# Patient Record
Sex: Female | Born: 1985 | Race: White | Hispanic: No | Marital: Married | Smoking: Never smoker
Health system: Southern US, Community
[De-identification: ages and names within clinical notes are randomized; demographics above are authoritative.]

## PROBLEM LIST (undated history)

## (undated) DIAGNOSIS — Z8619 Personal history of other infectious and parasitic diseases: Secondary | ICD-10-CM

## (undated) DIAGNOSIS — Z789 Other specified health status: Secondary | ICD-10-CM

## (undated) HISTORY — PX: WISDOM TOOTH EXTRACTION: SHX21

---

## 2014-07-02 LAB — OB RESULTS CONSOLE GC/CHLAMYDIA
CHLAMYDIA, DNA PROBE: NEGATIVE
Gonorrhea: NEGATIVE

## 2014-07-28 LAB — OB RESULTS CONSOLE RPR: RPR: NONREACTIVE

## 2014-07-28 LAB — OB RESULTS CONSOLE HEPATITIS B SURFACE ANTIGEN: Hepatitis B Surface Ag: NEGATIVE

## 2014-07-28 LAB — OB RESULTS CONSOLE HIV ANTIBODY (ROUTINE TESTING): HIV: NONREACTIVE

## 2014-07-28 LAB — OB RESULTS CONSOLE RUBELLA ANTIBODY, IGM: RUBELLA: IMMUNE

## 2014-07-28 LAB — OB RESULTS CONSOLE ANTIBODY SCREEN: Antibody Screen: NEGATIVE

## 2014-07-28 LAB — OB RESULTS CONSOLE ABO/RH: RH Type: POSITIVE

## 2014-11-22 LAB — OB RESULTS CONSOLE RPR: RPR: NONREACTIVE

## 2015-01-21 LAB — OB RESULTS CONSOLE GBS: GBS: NEGATIVE

## 2015-02-23 ENCOUNTER — Inpatient Hospital Stay (HOSPITAL_COMMUNITY)
Admission: AD | Admit: 2015-02-23 | Discharge: 2015-02-27 | DRG: 766 | Disposition: A | Discharge status: Home / Self Care | Payer: BLUE CROSS/BLUE SHIELD | Source: Ambulatory Visit | Attending: Obstetrics and Gynecology | Admitting: Obstetrics and Gynecology

## 2015-02-23 ENCOUNTER — Encounter (HOSPITAL_COMMUNITY): Payer: Self-pay | Admitting: *Deleted

## 2015-02-23 DIAGNOSIS — O9989 Other specified diseases and conditions complicating pregnancy, childbirth and the puerperium: Secondary | ICD-10-CM | POA: Diagnosis present

## 2015-02-23 DIAGNOSIS — O324XX0 Maternal care for high head at term, not applicable or unspecified: Secondary | ICD-10-CM | POA: Diagnosis present

## 2015-02-23 DIAGNOSIS — Z3A40 40 weeks gestation of pregnancy: Secondary | ICD-10-CM | POA: Diagnosis present

## 2015-02-23 DIAGNOSIS — O288 Other abnormal findings on antenatal screening of mother: Secondary | ICD-10-CM | POA: Diagnosis present

## 2015-02-23 HISTORY — DX: Other specified health status: Z78.9

## 2015-02-23 LAB — TYPE AND SCREEN
ABO/RH(D): A POS
Antibody Screen: NEGATIVE

## 2015-02-23 LAB — CBC
HCT: 31.2 % — ABNORMAL LOW (ref 36.0–46.0)
HEMOGLOBIN: 10.9 g/dL — AB (ref 12.0–15.0)
MCH: 27.5 pg (ref 26.0–34.0)
MCHC: 34.9 g/dL (ref 30.0–36.0)
MCV: 78.6 fL (ref 78.0–100.0)
Platelets: 233 10*3/uL (ref 150–400)
RBC: 3.97 MIL/uL (ref 3.87–5.11)
RDW: 13.4 % (ref 11.5–15.5)
WBC: 6.3 10*3/uL (ref 4.0–10.5)

## 2015-02-23 MED ORDER — BUTORPHANOL TARTRATE 1 MG/ML IJ SOLN
1.0000 mg | INTRAMUSCULAR | Status: DC | PRN
  Administered 2015-02-24 (×2): 1 mg via INTRAVENOUS
  Filled 2015-02-23 (×4): qty 1

## 2015-02-23 MED ORDER — OXYTOCIN 40 UNITS IN LACTATED RINGERS INFUSION - SIMPLE MED: 62.5000 mL/h | INTRAVENOUS | Status: DC

## 2015-02-23 MED ORDER — OXYTOCIN BOLUS FROM INFUSION: 500.0000 mL | INTRAVENOUS | Status: DC

## 2015-02-23 MED ORDER — LIDOCAINE HCL (PF) 1 % IJ SOLN
30.0000 mL | INTRAMUSCULAR | Status: DC | PRN
  Filled 2015-02-23: qty 30

## 2015-02-23 MED ORDER — FLEET ENEMA 7-19 GM/118ML RE ENEM: 1.0000 | ENEMA | RECTAL | Status: DC | PRN

## 2015-02-23 MED ORDER — OXYCODONE-ACETAMINOPHEN 5-325 MG PO TABS: 2.0000 | ORAL_TABLET | ORAL | Status: DC | PRN

## 2015-02-23 MED ORDER — MISOPROSTOL 25 MCG QUARTER TABLET
25.0000 ug | ORAL_TABLET | ORAL | Status: DC | PRN
  Administered 2015-02-23 – 2015-02-24 (×2): 25 ug via VAGINAL
  Filled 2015-02-23 (×2): qty 0.25

## 2015-02-23 MED ORDER — OXYCODONE-ACETAMINOPHEN 5-325 MG PO TABS: 1.0000 | ORAL_TABLET | ORAL | Status: DC | PRN

## 2015-02-23 MED ORDER — LACTATED RINGERS IV SOLN
INTRAVENOUS | Status: DC
  Administered 2015-02-23 – 2015-02-25 (×6): via INTRAVENOUS

## 2015-02-23 MED ORDER — ONDANSETRON HCL 4 MG/2ML IJ SOLN
4.0000 mg | Freq: Four times a day (QID) | INTRAMUSCULAR | Status: DC | PRN
  Administered 2015-02-25: 4 mg via INTRAVENOUS

## 2015-02-23 MED ORDER — LACTATED RINGERS IV SOLN
500.0000 mL | INTRAVENOUS | Status: DC | PRN
  Administered 2015-02-24 (×2): 500 mL via INTRAVENOUS
  Administered 2015-02-24: 1000 mL via INTRAVENOUS

## 2015-02-23 MED ORDER — ACETAMINOPHEN 325 MG PO TABS: 650.0000 mg | ORAL_TABLET | ORAL | Status: DC | PRN

## 2015-02-23 MED ORDER — CITRIC ACID-SODIUM CITRATE 334-500 MG/5ML PO SOLN
30.0000 mL | ORAL | Status: DC | PRN
  Administered 2015-02-25: 30 mL via ORAL
  Filled 2015-02-23: qty 15

## 2015-02-23 MED ORDER — TERBUTALINE SULFATE 1 MG/ML IJ SOLN: 0.2500 mg | Freq: Once | INTRAMUSCULAR | Status: AC | PRN

## 2015-02-23 NOTE — H&P (Signed)
29 y.o. G1P0 @ 3862w1d presents for IOL.  Was seen in office today for routine OB visit.  NST was non-reactive.  Baseline 140s, min-to-mod variability, no accels.  Given GA>40wks, decision made to proceed w IOL.  Otherwise has good fetal movement and no bleeding.  No past medical history on file. No past surgical history on file.  OB History  Gravida Para Term Preterm AB SAB TAB Ectopic Multiple Living  1             # Outcome Date GA Lbr Len/2nd Weight Sex Delivery Anes PTL Lv  1 Current               History   Social History  . Marital Status: Married    Spouse Name: N/A  . Number of Children: N/A  . Years of Education: N/A   Occupational History  . Not on file.   Social History Main Topics  . Smoking status: Not on file  . Smokeless tobacco: Not on file  . Alcohol Use: Not on file  . Drug Use: Not on file  . Sexual Activity: Not on file   Other Topics Concern  . Not on file   Social History Narrative  . No narrative on file   Review of patient's allergies indicates no known allergies.    Prenatal Transfer Tool  Maternal Diabetes: No Genetic Screening: Declined Maternal Ultrasounds/Referrals: Normal Fetal Ultrasounds or other Referrals:  None Maternal Substance Abuse:  No Significant Maternal Medications:  None Significant Maternal Lab Results: Lab values include: Group B Strep negative  Rubella:  Immune RPR: Nonreactive (03/28 0000)  GBS:   Neg   Other PNC: uncomplicated.    There were no vitals filed for this visit. --BP in office 112/68  General:  NAD Abdomen:  soft, gravid, EFW 8# Ex:  1+ edema b/l SVE:  Closed/60/high, vtx presentation confirmed by BSUS in office FHTs:  140s, mod var, no accels, no decels Toco:  Rare ctx   A/P   29 y.o. 3773w6d  G1P0 presents for IOL for equivocal fetal testing at term IOL--cervical ripening w cytotec Epidural upon maternal request  FSR/ vtx/ GBS neg  [facility] Florida[facility]ANNA GEFFEL Mickle Campton

## 2015-02-24 ENCOUNTER — Inpatient Hospital Stay (HOSPITAL_COMMUNITY): Payer: BLUE CROSS/BLUE SHIELD | Admitting: Anesthesiology

## 2015-02-24 LAB — RPR: RPR Ser Ql: NONREACTIVE

## 2015-02-24 LAB — ABO/RH: ABO/RH(D): A POS

## 2015-02-24 MED ORDER — LIDOCAINE HCL (PF) 1 % IJ SOLN
INTRAMUSCULAR | Status: DC | PRN
  Administered 2015-02-24: 4 mL
  Administered 2015-02-24: 6 mL

## 2015-02-24 MED ORDER — BUPIVACAINE HCL (PF) 0.25 % IJ SOLN
INTRAMUSCULAR | Status: DC | PRN
  Administered 2015-02-24: 7 mL
  Administered 2015-02-24: 8 mL

## 2015-02-24 MED ORDER — FENTANYL 2.5 MCG/ML BUPIVACAINE 1/10 % EPIDURAL INFUSION (WH - ANES)
14.0000 mL/h | INTRAMUSCULAR | Status: DC | PRN
  Administered 2015-02-24: 14 mL/h via EPIDURAL

## 2015-02-24 MED ORDER — DIPHENHYDRAMINE HCL 50 MG/ML IJ SOLN: 12.5000 mg | INTRAMUSCULAR | Status: DC | PRN

## 2015-02-24 MED ORDER — SODIUM CHLORIDE 0.9 % IV SOLN
1.0000 g | INTRAVENOUS | Status: DC
  Administered 2015-02-24 (×2): 1 g via INTRAVENOUS
  Filled 2015-02-24 (×6): qty 1000

## 2015-02-24 MED ORDER — PHENYLEPHRINE 40 MCG/ML (10ML) SYRINGE FOR IV PUSH (FOR BLOOD PRESSURE SUPPORT)
80.0000 ug | PREFILLED_SYRINGE | INTRAVENOUS | Status: DC | PRN
  Filled 2015-02-24 (×2): qty 20

## 2015-02-24 MED ORDER — ACETAMINOPHEN 160 MG/5ML PO SOLN
650.0000 mg | Freq: Four times a day (QID) | ORAL | Status: DC | PRN
  Administered 2015-02-24: 650 mg via ORAL
  Filled 2015-02-24: qty 20.3

## 2015-02-24 MED ORDER — ACETAMINOPHEN 650 MG RE SUPP
650.0000 mg | RECTAL | Status: DC | PRN
  Administered 2015-02-24: 650 mg via RECTAL
  Filled 2015-02-24: qty 1

## 2015-02-24 MED ORDER — OXYTOCIN 40 UNITS IN LACTATED RINGERS INFUSION - SIMPLE MED
1.0000 m[IU]/min | INTRAVENOUS | Status: DC
  Administered 2015-02-24: 2 m[IU]/min via INTRAVENOUS
  Filled 2015-02-24: qty 1000

## 2015-02-24 MED ORDER — EPHEDRINE 5 MG/ML INJ: 10.0000 mg | INTRAVENOUS | Status: DC | PRN

## 2015-02-24 MED ORDER — SODIUM BICARBONATE 8.4 % IV SOLN
INTRAVENOUS | Status: DC | PRN
  Administered 2015-02-24 (×2): 2 mL via EPIDURAL
  Administered 2015-02-25 (×2): 5 mL via EPIDURAL

## 2015-02-24 MED ORDER — FENTANYL 2.5 MCG/ML BUPIVACAINE 1/10 % EPIDURAL INFUSION (WH - ANES)
14.0000 mL/h | INTRAMUSCULAR | Status: DC | PRN
  Administered 2015-02-24 – 2015-02-25 (×3): 14 mL/h via EPIDURAL
  Filled 2015-02-24 (×3): qty 125

## 2015-02-24 NOTE — Anesthesia Procedure Notes (Signed)

## 2015-02-24 NOTE — Anesthesia Preprocedure Evaluation (Signed)

## 2015-02-25 ENCOUNTER — Encounter (HOSPITAL_COMMUNITY): Payer: Self-pay | Admitting: Anesthesiology

## 2015-02-25 ENCOUNTER — Encounter (HOSPITAL_COMMUNITY)
Admission: AD | Disposition: A | Discharge status: Home / Self Care | Payer: Self-pay | Source: Ambulatory Visit | Attending: Obstetrics and Gynecology

## 2015-02-25 LAB — CBC
HCT: 23.8 % — ABNORMAL LOW (ref 36.0–46.0)
Hemoglobin: 8.2 g/dL — ABNORMAL LOW (ref 12.0–15.0)
MCH: 27.5 pg (ref 26.0–34.0)
MCHC: 34.5 g/dL (ref 30.0–36.0)
MCV: 79.9 fL (ref 78.0–100.0)
Platelets: 154 10*3/uL (ref 150–400)
RBC: 2.98 MIL/uL — ABNORMAL LOW (ref 3.87–5.11)
RDW: 13.6 % (ref 11.5–15.5)
WBC: 17.5 10*3/uL — ABNORMAL HIGH (ref 4.0–10.5)

## 2015-02-25 SURGERY — Surgical Case
Anesthesia: Epidural | Site: Abdomen

## 2015-02-25 MED ORDER — PRENATAL MULTIVITAMIN CH
1.0000 | ORAL_TABLET | Freq: Every day | ORAL | Status: DC
  Filled 2015-02-25: qty 1

## 2015-02-25 MED ORDER — MEPERIDINE HCL 25 MG/ML IJ SOLN: 6.2500 mg | INTRAMUSCULAR | Status: DC | PRN

## 2015-02-25 MED ORDER — TETANUS-DIPHTH-ACELL PERTUSSIS 5-2.5-18.5 LF-MCG/0.5 IM SUSP: 0.5000 mL | Freq: Once | INTRAMUSCULAR | Status: DC

## 2015-02-25 MED ORDER — OXYCODONE HCL 5 MG/5ML PO SOLN
10.0000 mg | ORAL | Status: DC | PRN
  Administered 2015-02-26 – 2015-02-27 (×2): 10 mg via ORAL
  Filled 2015-02-25: qty 10

## 2015-02-25 MED ORDER — KETOROLAC TROMETHAMINE 30 MG/ML IJ SOLN
30.0000 mg | Freq: Four times a day (QID) | INTRAMUSCULAR | Status: DC | PRN
  Administered 2015-02-25: 30 mg via INTRAMUSCULAR

## 2015-02-25 MED ORDER — DIPHENHYDRAMINE HCL 25 MG PO CAPS: 25.0000 mg | ORAL_CAPSULE | ORAL | Status: DC | PRN

## 2015-02-25 MED ORDER — NALBUPHINE HCL 10 MG/ML IJ SOLN: 5.0000 mg | Freq: Once | INTRAMUSCULAR | Status: AC | PRN

## 2015-02-25 MED ORDER — ZOLPIDEM TARTRATE 5 MG PO TABS: 5.0000 mg | ORAL_TABLET | Freq: Every evening | ORAL | Status: DC | PRN

## 2015-02-25 MED ORDER — FENTANYL CITRATE (PF) 100 MCG/2ML IJ SOLN: 25.0000 ug | INTRAMUSCULAR | Status: DC | PRN

## 2015-02-25 MED ORDER — LACTATED RINGERS IV SOLN
INTRAVENOUS | Status: DC
  Administered 2015-02-25 (×2): via INTRAVENOUS

## 2015-02-25 MED ORDER — OXYCODONE-ACETAMINOPHEN 5-325 MG PO TABS: 2.0000 | ORAL_TABLET | ORAL | Status: DC | PRN

## 2015-02-25 MED ORDER — LANOLIN HYDROUS EX OINT: 1.0000 "application " | TOPICAL_OINTMENT | CUTANEOUS | Status: DC | PRN

## 2015-02-25 MED ORDER — SCOPOLAMINE 1 MG/3DAYS TD PT72
1.0000 | MEDICATED_PATCH | Freq: Once | TRANSDERMAL | Status: DC
  Filled 2015-02-25: qty 1

## 2015-02-25 MED ORDER — NALOXONE HCL 1 MG/ML IJ SOLN
1.0000 ug/kg/h | INTRAVENOUS | Status: DC | PRN
  Filled 2015-02-25: qty 2

## 2015-02-25 MED ORDER — MENTHOL 3 MG MT LOZG: 1.0000 | LOZENGE | OROMUCOSAL | Status: DC | PRN

## 2015-02-25 MED ORDER — ONDANSETRON HCL 4 MG/2ML IJ SOLN
4.0000 mg | Freq: Three times a day (TID) | INTRAMUSCULAR | Status: DC | PRN
  Administered 2015-02-25: 4 mg via INTRAVENOUS
  Filled 2015-02-25: qty 2

## 2015-02-25 MED ORDER — DIPHENHYDRAMINE HCL 50 MG/ML IJ SOLN: 12.5000 mg | INTRAMUSCULAR | Status: DC | PRN

## 2015-02-25 MED ORDER — CEFAZOLIN SODIUM-DEXTROSE 2-3 GM-% IV SOLR
INTRAVENOUS | Status: DC | PRN
  Administered 2015-02-25: 2 g via INTRAVENOUS

## 2015-02-25 MED ORDER — KETOROLAC TROMETHAMINE 30 MG/ML IJ SOLN
INTRAMUSCULAR | Status: AC
  Filled 2015-02-25: qty 1

## 2015-02-25 MED ORDER — MORPHINE SULFATE 0.5 MG/ML IJ SOLN
INTRAMUSCULAR | Status: AC
  Filled 2015-02-25: qty 10

## 2015-02-25 MED ORDER — ACETAMINOPHEN 500 MG PO TABS
1000.0000 mg | ORAL_TABLET | Freq: Four times a day (QID) | ORAL | Status: AC
  Filled 2015-02-25: qty 2

## 2015-02-25 MED ORDER — BUPIVACAINE HCL (PF) 0.25 % IJ SOLN
INTRAMUSCULAR | Status: AC
  Filled 2015-02-25: qty 30

## 2015-02-25 MED ORDER — DIBUCAINE 1 % RE OINT: 1.0000 "application " | TOPICAL_OINTMENT | RECTAL | Status: DC | PRN

## 2015-02-25 MED ORDER — ACETAMINOPHEN 325 MG PO TABS: 650.0000 mg | ORAL_TABLET | ORAL | Status: DC | PRN

## 2015-02-25 MED ORDER — SODIUM CHLORIDE 0.9 % IJ SOLN: 3.0000 mL | INTRAMUSCULAR | Status: DC | PRN

## 2015-02-25 MED ORDER — ACETAMINOPHEN 160 MG/5ML PO SOLN: 325.0000 mg | ORAL | Status: DC | PRN

## 2015-02-25 MED ORDER — SIMETHICONE 80 MG PO CHEW
80.0000 mg | CHEWABLE_TABLET | Freq: Three times a day (TID) | ORAL | Status: DC
  Administered 2015-02-25 – 2015-02-27 (×6): 80 mg via ORAL
  Filled 2015-02-25 (×7): qty 1

## 2015-02-25 MED ORDER — IBUPROFEN 100 MG/5ML PO SUSP
600.0000 mg | Freq: Four times a day (QID) | ORAL | Status: DC
  Administered 2015-02-25 – 2015-02-27 (×8): 600 mg via ORAL
  Filled 2015-02-25 (×13): qty 30

## 2015-02-25 MED ORDER — OXYTOCIN 10 UNIT/ML IJ SOLN
INTRAMUSCULAR | Status: AC
  Filled 2015-02-25: qty 4

## 2015-02-25 MED ORDER — SENNOSIDES-DOCUSATE SODIUM 8.6-50 MG PO TABS
2.0000 | ORAL_TABLET | ORAL | Status: DC
  Administered 2015-02-25 – 2015-02-27 (×2): 2 via ORAL
  Filled 2015-02-25 (×2): qty 2

## 2015-02-25 MED ORDER — OXYTOCIN 10 UNIT/ML IJ SOLN
40.0000 [IU] | INTRAVENOUS | Status: DC | PRN
  Administered 2015-02-25: 40 [IU] via INTRAVENOUS

## 2015-02-25 MED ORDER — OXYCODONE-ACETAMINOPHEN 5-325 MG PO TABS
1.0000 | ORAL_TABLET | ORAL | Status: DC | PRN
Start: 2015-02-25 — End: 2015-02-25
  Administered 2015-02-25: 1 via ORAL
  Filled 2015-02-25: qty 1

## 2015-02-25 MED ORDER — CEFAZOLIN SODIUM-DEXTROSE 2-3 GM-% IV SOLR
INTRAVENOUS | Status: AC
  Filled 2015-02-25: qty 50

## 2015-02-25 MED ORDER — WITCH HAZEL-GLYCERIN EX PADS: 1.0000 "application " | MEDICATED_PAD | CUTANEOUS | Status: DC | PRN

## 2015-02-25 MED ORDER — NALOXONE HCL 0.4 MG/ML IJ SOLN: 0.4000 mg | INTRAMUSCULAR | Status: DC | PRN

## 2015-02-25 MED ORDER — OXYCODONE HCL 5 MG/5ML PO SOLN
5.0000 mg | ORAL | Status: DC | PRN
  Administered 2015-02-26 – 2015-02-27 (×4): 5 mg via ORAL
  Filled 2015-02-25 (×5): qty 5

## 2015-02-25 MED ORDER — CHLOROPROCAINE HCL 3 % IJ SOLN
INTRAMUSCULAR | Status: AC
  Filled 2015-02-25: qty 20

## 2015-02-25 MED ORDER — SIMETHICONE 80 MG PO CHEW
80.0000 mg | CHEWABLE_TABLET | ORAL | Status: DC
  Administered 2015-02-25 – 2015-02-27 (×2): 80 mg via ORAL
  Filled 2015-02-25 (×2): qty 1

## 2015-02-25 MED ORDER — NALBUPHINE HCL 10 MG/ML IJ SOLN: 5.0000 mg | INTRAMUSCULAR | Status: DC | PRN

## 2015-02-25 MED ORDER — MORPHINE SULFATE (PF) 0.5 MG/ML IJ SOLN
INTRAMUSCULAR | Status: DC | PRN
Start: 2015-02-25 — End: 2015-02-25
  Administered 2015-02-25: 2000 ug via INTRAVENOUS
  Administered 2015-02-25: 3000 ug via EPIDURAL

## 2015-02-25 MED ORDER — COMPLETENATE 29-1 MG PO CHEW
1.0000 | CHEWABLE_TABLET | Freq: Every day | ORAL | Status: DC
  Administered 2015-02-25 – 2015-02-27 (×2): 1 via ORAL
  Filled 2015-02-25 (×4): qty 1

## 2015-02-25 MED ORDER — ACETAMINOPHEN 160 MG/5ML PO SOLN: 650.0000 mg | ORAL | Status: DC | PRN

## 2015-02-25 MED ORDER — ONDANSETRON HCL 4 MG/2ML IJ SOLN
INTRAMUSCULAR | Status: AC
  Filled 2015-02-25: qty 2

## 2015-02-25 MED ORDER — PROMETHAZINE HCL 25 MG/ML IJ SOLN: 6.2500 mg | INTRAMUSCULAR | Status: DC | PRN

## 2015-02-25 MED ORDER — KETOROLAC TROMETHAMINE 30 MG/ML IJ SOLN: 30.0000 mg | Freq: Four times a day (QID) | INTRAMUSCULAR | Status: DC | PRN

## 2015-02-25 MED ORDER — OXYTOCIN 40 UNITS IN LACTATED RINGERS INFUSION - SIMPLE MED: 62.5000 mL/h | INTRAVENOUS | Status: AC

## 2015-02-25 MED ORDER — IBUPROFEN 600 MG PO TABS
600.0000 mg | ORAL_TABLET | Freq: Four times a day (QID) | ORAL | Status: DC
  Filled 2015-02-25: qty 1

## 2015-02-25 MED ORDER — SIMETHICONE 80 MG PO CHEW: 80.0000 mg | CHEWABLE_TABLET | ORAL | Status: DC | PRN

## 2015-02-25 MED ORDER — DIPHENHYDRAMINE HCL 25 MG PO CAPS: 25.0000 mg | ORAL_CAPSULE | Freq: Four times a day (QID) | ORAL | Status: DC | PRN

## 2015-02-25 MED ORDER — LIDOCAINE-EPINEPHRINE (PF) 2 %-1:200000 IJ SOLN
INTRAMUSCULAR | Status: AC
  Filled 2015-02-25: qty 20

## 2015-02-25 SURGICAL SUPPLY — 28 items
CLAMP CORD UMBIL (MISCELLANEOUS) IMPLANT
CLOTH BEACON ORANGE TIMEOUT ST (SAFETY) ×3 IMPLANT
DRAPE SHEET LG 3/4 BI-LAMINATE (DRAPES) IMPLANT
DRSG OPSITE POSTOP 4X10 (GAUZE/BANDAGES/DRESSINGS) ×3 IMPLANT
DURAPREP 26ML APPLICATOR (WOUND CARE) ×3 IMPLANT
ELECT REM PT RETURN 9FT ADLT (ELECTROSURGICAL) ×3
ELECTRODE REM PT RTRN 9FT ADLT (ELECTROSURGICAL) ×1 IMPLANT
EXTRACTOR VACUUM M CUP 4 TUBE (SUCTIONS) ×2 IMPLANT
EXTRACTOR VACUUM M CUP 4' TUBE (SUCTIONS) ×1
GLOVE BIO SURGEON STRL SZ7 (GLOVE) ×3 IMPLANT
GOWN STRL REUS W/TWL LRG LVL3 (GOWN DISPOSABLE) ×6 IMPLANT
KIT ABG SYR 3ML LUER SLIP (SYRINGE) IMPLANT
LIQUID BAND (GAUZE/BANDAGES/DRESSINGS) ×3 IMPLANT
NEEDLE HYPO 22GX1.5 SAFETY (NEEDLE) IMPLANT
NEEDLE HYPO 25X5/8 SAFETYGLIDE (NEEDLE) IMPLANT
NS IRRIG 1000ML POUR BTL (IV SOLUTION) ×3 IMPLANT
PACK C SECTION WH (CUSTOM PROCEDURE TRAY) ×3 IMPLANT
PAD OB MATERNITY 4.3X12.25 (PERSONAL CARE ITEMS) ×3 IMPLANT
RTRCTR C-SECT PINK 25CM LRG (MISCELLANEOUS) ×3 IMPLANT
SUT CHROMIC 1 CTX 36 (SUTURE) ×9 IMPLANT
SUT CHROMIC 2 0 CT 1 (SUTURE) ×3 IMPLANT
SUT PDS AB 0 CTX 60 (SUTURE) ×3 IMPLANT
SUT VIC AB 2-0 CT1 27 (SUTURE) ×2
SUT VIC AB 2-0 CT1 TAPERPNT 27 (SUTURE) ×1 IMPLANT
SUT VIC AB 4-0 KS 27 (SUTURE) IMPLANT
SYR 30ML LL (SYRINGE) IMPLANT
TOWEL OR 17X24 6PK STRL BLUE (TOWEL DISPOSABLE) ×3 IMPLANT
TRAY FOLEY CATH SILVER 14FR (SET/KITS/TRAYS/PACK) IMPLANT

## 2015-02-25 NOTE — Addendum Note (Signed)
Addendum  created 02/25/15 0855 by Angela Adamana G Amaryah Mallen, CRNA   Modules edited: Notes Section   Notes Section:  File: [id number]

## 2015-02-25 NOTE — Op Note (Signed)
Pre-Operative Diagnosis: 1) 40+3 week intrauterine pregnancy 2) maternal fever 3) failure to descend Postoperative Diagnosis: Same Procedure: Primary Low transverse cesarean section Surgeon: Dr. Waynard ReedsKendra Klaira Pesci Assistant: none Operative Findings: vigorous female infant in vertex presentation weighing 7#4 with apgars of 8 & 9 Specimen: placenta EBL: Total I/O In: 1200 [I.V.:1200] Out: 1700 [Urine:900; Blood:800]   Procedure:Ms. Krystal Taylor is an 29 year old gravida 1 para 0 at 40 weeks and 3 days estimated gestational age who presents for cesarean section. The patient pushed for 2 hours with minimal descent of the vertex. Maternal expulsive efforts were inadequate and the vertex was too high for an operative vaginal delivery. Therefore the decision was made to proceed with cesarean delivery. Following the appropriate informed consent the patient was brought to the operating room where epidural anesthesia was administered and found to be adequate. She was placed in the dorsal supine position with a leftward tilt. She was prepped and draped in the normal sterile fashion. Scalpel was then used to make a Pfannenstiel skin incision which was carried down to the underlying layers of soft tissue to the fascia. The fascia was incised in the midline and the fascial incision was extended laterally with Mayo scissors. The superior aspect of the fascial incision was grasped with Coker clamps x2, tented up and the rectus muscles dissected off sharply with the electrocautery unit area and the same procedure was repeated on the inferior aspect of the fascial incision. The rectus muscles were separated in the midline. The abdominal peritoneum was identified, tented up, entered sharply, and the incision was extended superiorly and inferiorly with good visualization of the bladder. The Alexis retractor was then deployed. The vesicouterine peritoneum was identified, tented up, entered sharply, and the bladder flap was created  digitally. Scalpel was then used to make a low transverse incision on the uterus which was extended laterally with blunt dissection. The fetal vertex was identified, delivered easily through the uterine incision followed by the body. The infant was bulb suctioned on the operative field cried vigorously, cord was clamped and cut and the infant was passed to the waiting neonatologist. Placenta was then delivered spontaneously, the uterus was cleared of all clot and debris. The uterine incision was repaired with #1 chromic in running locked fashion followed by a second imbricating layer. Ovaries and tubes were inspected and normal. The Alexis retractor was removed. The uterus was returned to the abdominal cavity the abdominal cavity was cleared of all clot and debris. The abdominal peritoneum was reapproximated with 2-0 Vicryl in a running fashion, the rectus muscles was reapproximated with #1 chromic in a running fashion. The fascia was closed with a looped PDS in a running fashion. The skin was closed with 4-0 vicryl in a subcuticular fashion and surgical skin glue. All sponge lap and needle counts were correct x2. Patient tolerated the procedure well and recovered in stable condition following the procedure.

## 2015-02-25 NOTE — Anesthesia Postprocedure Evaluation (Signed)
  Anesthesia Post-op Note  Patient: Krystal RodriguezKelly Cryan  Procedure(s) Performed: Procedure(s): CESAREAN SECTION (N/A)  Patient Location: PACU  Anesthesia Type:Epidural  Level of Consciousness: awake and alert   Airway and Oxygen Therapy: Patient Spontanous Breathing  Post-op Pain: mild  Post-op Assessment: Post-op Vital signs reviewed and Patient's Cardiovascular Status Stable              Post-op Vital Signs: Reviewed and stable  Last Vitals:  Filed Vitals:   02/25/15 0120  BP: 129/76  Pulse: 110  Temp: 37.9 C  Resp:     Complications: No apparent anesthesia complications

## 2015-02-25 NOTE — Lactation Note (Signed)
This note was copied from the chart of Krystal Taylor. Lactation Consultation Note  Patient Name: Krystal Taylor Today's Date: 02/25/2015 Reason for consult: Initial assessment  With this mom of a term infant, now 7112 hours old, and has not fed yet. Breast feeding attempts have been made, but baby would not latch, as per mom.  I did show mom how to hand express, and she has easily expressed colostrum. Mom was able to return demonstrate hand exp. Mom has small, short nipples, but soft, compressible breast tissue. The baby was getting ready for his first bath, so mom will call for help with latch after this.    Maternal Data Formula Feeding for Exclusion: No Has patient been taught Hand Expression?: Yes Does the patient have breastfeeding experience prior to this delivery?: No  Feeding    LATCH Score/Interventions    Intervention(s): Hand expression  Type of Nipple: Everted at rest and after stimulation (large breasts, small, short nipples)  Comfort (Breast/Nipple): Soft / non-tender           Lactation Tools Discussed/Used     Consult Status Consult Status: Follow-up Date: 02/25/15 Follow-up type: In-patient    Alfred LevinsLee, Delsy Etzkorn Anne 02/25/2015, 2:03 PM

## 2015-02-25 NOTE — Anesthesia Postprocedure Evaluation (Signed)
  Anesthesia Post-op Note  Patient: Krystal RodriguezKelly Taylor  Procedure(s) Performed: Procedure(s): CESAREAN SECTION (N/A)  Patient Location: Mother/Baby  Anesthesia Type:Epidural  Level of Consciousness: awake and alert   Airway and Oxygen Therapy: Patient Spontanous Breathing  Post-op Pain: mild  Post-op Assessment: Post-op Vital signs reviewed, Patient's Cardiovascular Status Stable, Respiratory Function Stable, No signs of Nausea or vomiting, Pain level controlled, No headache, Spinal receding and Patient able to bend at knees  Post-op Vital Signs: Reviewed  Last Vitals:  Filed Vitals:   02/25/15 0728  BP: 118/63  Pulse: 80  Temp: 36.9 C  Resp: 18    Complications: No apparent anesthesia complications

## 2015-02-25 NOTE — Discharge Summary (Signed)
Obstetric Discharge Summary Reason for Admission: induction of labor Prenatal Procedures: NST Intrapartum Procedures: cesarean: low cervical, transverse Postpartum Procedures: none Complications-Operative and Postpartum: none HEMOGLOBIN  Date Value Ref Range Status  02/25/2015 8.2* 12.0 - 15.0 g/dL Final    Comment:    REPEATED TO VERIFY DELTA CHECK NOTED    HCT  Date Value Ref Range Status  02/25/2015 23.8* 36.0 - 46.0 % Final    Discharge Diagnoses: Term Pregnancy-delivered  Discharge Information: Date: 02/25/2015 Activity: pelvic rest Diet: routine Medications: Iron and Percocet Condition: stable Instructions: refer to practice specific booklet Discharge to: home Follow-up Information    Follow up with [name],[name], MD In 4 weeks.   Specialty:  Obstetrics and Gynecology   Contact information:   [address] [place] [address] [phone]       Newborn Data: Live born female  Birth Weight: 7 lb 4 oz (3289 g) APGAR: 8, 9  Home with mother.  [name] A 02/25/2015, 7:24 AM

## 2015-02-25 NOTE — Transfer of Care (Signed)
Immediate Anesthesia Transfer of Care Note  Patient: Krystal RodriguezKelly Taylor  Procedure(s) Performed: Procedure(s): CESAREAN SECTION (N/A)  Patient Location: PACU  Anesthesia Type:Epidural  Level of Consciousness: awake, alert  and oriented  Airway & Oxygen Therapy: Patient Spontanous Breathing  Post-op Assessment: Report given to RN and Post -op Vital signs reviewed and stable  Post vital signs: Reviewed and stable  Last Vitals:  Filed Vitals:   02/25/15 0120  BP: 129/76  Pulse: 110  Temp: 37.9 C  Resp:     Complications: No apparent anesthesia complications

## 2015-02-25 NOTE — Progress Notes (Signed)
  Patient is eating, ambulating, voiding.  Pain control is good.  Filed Vitals:   02/25/15 0400 02/25/15 0415 02/25/15 0438 02/25/15 0557  BP: 115/66  118/68 116/69  Pulse: 77 79 72 85  Temp: 98.6 F (37 C)  98.6 F (37 C) 98.8 F (37.1 C)  TempSrc:   Axillary Axillary  Resp: 23 27 18 18   Height:      Weight:      SpO2: 97% 98% 96% 96%    lungs:   clear to auscultation cor:    RRR Abdomen:  soft, appropriate tenderness, incisions intact and without erythema or exudate ex:    no cords   Lab Results  Component Value Date   WBC 17.5* 02/25/2015   HGB 8.2* 02/25/2015   HCT 23.8* 02/25/2015   MCV 79.9 02/25/2015   PLT 154 02/25/2015    --/--/A POS, A POS (06/29 1815)/RI  A/P    Post operative day 0.  Routine post op and postpartum care.  Expect d/c routine.  Percocet for pain control.

## 2015-02-25 NOTE — Progress Notes (Signed)
Patient ID: Krystal RodriguezKelly Taylor, female   DOB: 22-Oct-1985, 29 y.o.   MRN: [id number]  Pt has been pushing for 2 hours with poor maternal effort and no decent of the fetal vertex. Attempts to labor down were unsuccessful due to pt inability to tolerate sensation of pressure. Vertex too high to safely attempt operative vaginal delivery. Therefore, discussed with patient the need to proceed with cesarean delivery. R/B/A reviewed. Consent obtained. Will proceed with cesarean

## 2015-02-26 NOTE — Progress Notes (Signed)
Patient is eating, ambulating, voiding.  Pain control is good.  Filed Vitals:   02/25/15 2009 02/25/15 2347 02/26/15 0158 02/26/15 0506  BP: 102/61 107/66 122/78 127/74  Pulse: 79 78 89 89  Temp: 98.4 F (36.9 C) 97.6 F (36.4 C) 98.9 F (37.2 C) 98.6 F (37 C)  TempSrc: Oral Oral Oral Oral  Resp: 20 20 20 18   Height:      Weight:      SpO2: 95% 96% 97%     Fundus firm Perineum without swelling.  Lab Results  Component Value Date   WBC 17.5* 02/25/2015   HGB 8.2* 02/25/2015   HCT 23.8* 02/25/2015   MCV 79.9 02/25/2015   PLT 154 02/25/2015    --/--/A POS, A POS (06/29 1815)/RI  A/P Post partum day 1.  Routine care.  Expect d/c tomorrow.    Nakota Ackert A

## 2015-02-26 NOTE — Lactation Note (Signed)
This note was copied from the chart of Krystal Taylor. Lactation Consultation Note: Called to assist mom. She is anxious about how much the baby is getting. Mom easily able to hand express Colostrum. Baby licked it off then went to sleep. Mom pumping as I left room. Encouraged to hand express after pumping and spoon fed all EBM. Reassurance given. No further questions at present. To call for assist prn  Patient Name: Krystal Taylor Reason for consult: Follow-up assessment   Maternal Data Formula Feeding for Exclusion: No Has patient been taught Hand Expression?: Yes Does the patient have breastfeeding experience prior to this delivery?: No  Feeding Feeding Type: Breast Fed Length of feed: 15 min  LATCH Score/Interventions Latch: Repeated attempts needed to sustain latch, nipple held in mouth throughout feeding, stimulation needed to elicit sucking reflex. Intervention(s): Teach feeding cues  Audible Swallowing: None Intervention(s): Hand expression  Type of Nipple: Everted at rest and after stimulation  Comfort (Breast/Nipple): Soft / non-tender  Problem noted: Mild/Moderate discomfort  Hold (Positioning): Assistance needed to correctly position infant at breast and maintain latch. Intervention(s): Breastfeeding basics reviewed  LATCH Score: 6  Lactation Tools Discussed/Used     Consult Status Consult Status: Follow-up Date: 02/27/15 Follow-up type: In-patient    Pamelia HoitWeeks, Travoris Bushey D Taylor, 4:07 PM

## 2015-02-26 NOTE — Progress Notes (Signed)
Parents desires circumsision.  All risks, benefits and alternatives discussed with the mother.  Will do tomorrow secondary feeding considerations.

## 2015-02-27 MED ORDER — EPINEPHRINE TOPICAL FOR CIRCUMCISION 0.1 MG/ML: 1.0000 [drp] | TOPICAL | Status: DC | PRN

## 2015-02-27 MED ORDER — OXYCODONE-ACETAMINOPHEN 5-325 MG PO TABS: 1.0000 | ORAL_TABLET | ORAL | Status: DC | PRN

## 2015-02-27 MED ORDER — LIDOCAINE 1%/NA BICARB 0.1 MEQ INJECTION
0.8000 mL | INJECTION | Freq: Once | INTRAVENOUS | Status: DC
  Filled 2015-02-27: qty 1

## 2015-02-27 MED ORDER — SUCROSE 24% NICU/PEDS ORAL SOLUTION
0.5000 mL | OROMUCOSAL | Status: DC | PRN
  Filled 2015-02-27: qty 0.5

## 2015-02-27 MED ORDER — ACETAMINOPHEN FOR CIRCUMCISION 160 MG/5 ML: 40.0000 mg | ORAL | Status: DC | PRN

## 2015-02-27 MED ORDER — ACETAMINOPHEN FOR CIRCUMCISION 160 MG/5 ML: 40.0000 mg | Freq: Once | ORAL | Status: DC

## 2015-02-27 NOTE — Lactation Note (Signed)
This note was copied from the chart of Krystal Taylor. Lactation Consultation Note; Mom reports baby has been feeding much better through the night. Reports she feels much more confident with breast feeding Baby awakening mom latched baby easily with minimal assistance from me. Swallows heard. Discussed engorgement prevention and treatment. No further questions at present. To call prn  Patient Name: Krystal Ricki RodriguezKelly Taylor RUEAV'WToday's Date: 02/27/2015 Reason for consult: Follow-up assessment   Maternal Data Formula Feeding for Exclusion: No Has patient been taught Hand Expression?: Yes Does the patient have breastfeeding experience prior to this delivery?: No  Feeding Feeding Type: Breast Fed  LATCH Score/Interventions Latch: Grasps breast easily, tongue down, lips flanged, rhythmical sucking.  Audible Swallowing: A few with stimulation  Type of Nipple: Everted at rest and after stimulation  Comfort (Breast/Nipple): Soft / non-tender     Hold (Positioning): Assistance needed to correctly position infant at breast and maintain latch. Intervention(s): Breastfeeding basics reviewed  LATCH Score: 8  Lactation Tools Discussed/Used WIC Program: No   Consult Status Consult Status: Complete    Pamelia HoitWeeks, Cherrelle Plante D 02/27/2015, 2:04 PM

## 2015-02-27 NOTE — Progress Notes (Signed)
Patient is eating, ambulating, voiding.  Pain control is good.  Filed Vitals:   02/26/15 0506 02/26/15 1055 02/26/15 1800 02/27/15 0601  BP: 127/74 129/77 124/79 129/63  Pulse: 89 100 98 68  Temp: 98.6 F (37 C) 98.4 F (36.9 C) 98.2 F (36.8 C) 98 F (36.7 C)  TempSrc: Oral Oral Oral Oral  Resp: 18 18 19 18   Height:      Weight:      SpO2:   98% 99%    Fundus firm Perineum without swelling.  Lab Results  Component Value Date   WBC 17.5* 02/25/2015   HGB 8.2* 02/25/2015   HCT 23.8* 02/25/2015   MCV 79.9 02/25/2015   PLT 154 02/25/2015    --/--/A POS, A POS (06/29 1815)/RI  A/P Post partum day 2.  Routine care.  Expect d/c today.   Parents desires circumsision.  All risks, benefits and alternatives discussed with the mother.  Dois Juarbe A

## 2015-02-28 ENCOUNTER — Encounter (HOSPITAL_COMMUNITY): Payer: Self-pay | Admitting: Obstetrics and Gynecology

## 2015-03-01 ENCOUNTER — Encounter (HOSPITAL_COMMUNITY): Payer: Self-pay | Admitting: Obstetrics and Gynecology

## 2015-03-01 ENCOUNTER — Inpatient Hospital Stay (HOSPITAL_COMMUNITY): Payer: BLUE CROSS/BLUE SHIELD

## 2015-03-01 NOTE — Addendum Note (Signed)
Addendum  created 03/01/15 1112 by Sebastian Acheheodore Cassell Voorhies, MD   Modules edited: Anesthesia Events, Narrator   Narrator:  Narrator: Event Log Edited

## 2015-03-02 ENCOUNTER — Inpatient Hospital Stay (HOSPITAL_COMMUNITY): Admission: RE | Admit: 2015-03-02 | Payer: BLUE CROSS/BLUE SHIELD | Source: Ambulatory Visit

## 2015-03-03 ENCOUNTER — Ambulatory Visit (HOSPITAL_COMMUNITY)
Admission: RE | Admit: 2015-03-03 | Discharge: 2015-03-03 | Disposition: A | Discharge status: Home / Self Care | Payer: BLUE CROSS/BLUE SHIELD | Source: Ambulatory Visit | Attending: Obstetrics | Admitting: Obstetrics

## 2015-03-03 NOTE — Lactation Note (Signed)
Lactation Consult  Mother's reason for visit:  Work on issues with breastfeeding , latch , also to work on a plan to move forward  Visit Type:  Feeding assessment  Appointment Notes:  No note - confirmed  Consult:  Initial Lactation Consultant:  Krystal Taylor, Krystal Taylor  ________________________________________________________________________  Krystal FloresBaby's Name: Krystal EwingJaden Lee Taylor Date of Birth: 02/25/2013 Pediatrician: Dr. Norris Cross"Krystal Taylor  Gender: female Gestational Age: 573w3d (At Birth) Birth Weight: 7 lb 4 oz (3289 g) Weight at Discharge: Weight: 6 lb 11.8 oz (3055 g)Date of Discharge: 02/27/2015 Filed Weights   02/25/15 0159 02/25/15 2300 02/26/15 2300  Weight: 7 lb 4 oz (3289 g) 7 lb (3175 g) 6 lb 11.8 oz (3055 g)   [facility]-14 [facility] prior to D[facility]C from [facility]  Location:[facility] Weight today: 7-2.2 oz 3238 g    ________________________________________________________________________  Mother's Name: Krystal RodriguezKelly Taylor Type of delivery:  Vaginal  Breastfeeding Experience:  1st baby ,  Maternal Medical Conditions:  No risk ,except edema ,  Maternal Medications:  PNV   ________________________________________________________________________  Breastfeeding History (Post Discharge) - breast feeding was going well in the [place] until going home  And then I started having problems - the baby wasn't peeing or pooping , [facility] , and we were admitted  Where I stopped latching and started supplementing with formula from a bottle , and only pumped with a DEBP a few times,milk was coming in before [facility] . And 48 ml . Was D/C yesterday and attempted to BF at home X2 for 10 -15 mins and then supplemented.  Per mom since he had to be readmitted I'm scared he isn't getting enough.   Frequency of breastfeeding:  x2 since being readmitted  Duration of feeding:   10 -15 mins , then supplemented   Supplementing: Per mom most feeding are from a bottle  See above note   [facility] hand pump .  Requested to rent one until insurance pump comes in  Infant Intake and Output Assessment  Voids:  8-9 24 hrs.  Color:  Clear yellow Stools:  4-5 24 hrs.  Color:  Brown and Yellow  ________________________________________________________________________  Maternal Breast Assessment  Breast:  Full Nipple:  Flat ( compressible areolas for latching  Pain level:  0 Pain interventions:  Expressed breast milk  _______________________________________________________________________ Feeding Assessment/Evaluation - baby  Still slightly jaundice , wake and hungry , well hydrated   Initial feeding assessment:  Infant's oral assessment:  Variance - LC noted a short labial frenulum, short posterior frenulum  Able to stretch upper lip with exam and at the breast with assist to compress the breast .  Mom is comfortable with latch.   Positioning:  Football Right breast  LATCH documentation:  Latch:  2 = Grasps breast easily, tongue down, lips flanged, rhythmical sucking.  Audible swallowing:  2 = Spontaneous and intermittent  Type of nipple:1Semi flat , compressible areolas   Comfort (Breast/Nipple):  1 = Filling, red/small blisters or bruises, mild/mod discomfort  Hold (Positioning):  1 = Assistance needed to correctly position infant at breast and maintain latch  LATCH score:  7   Attached assessment:  Deep  Lips flanged:  Yes.    Lips untucked:  Yes.    Suck assessment:  Nutritive  Tools:  None  Instructed on use and cleaning of tool:  No.  Pre-feed weight:  3238 g, ( 7-4.1 oz  Post-feed weight:  3292g  , 7-4.1 oz  Amount transferred:  54 ml  Amount supplemented:  None   Additional Feeding Assessment -   Infant's oral assessment:  Variance ( see above note )   Positioning:  Cross cradle  Left  breast  LATCH documentation:  Latch:  2 = Grasps breast easily, tongue down, lips flanged, rhythmical sucking.  Audible swallowing:  2 = Spontaneous and intermittent  Type of nipple:  1 = Flat to compressible areolas - good for latch   Comfort (Breast/Nipple):  1 = Filling, red/small blisters or bruises, mild/mod discomfort  Hold (Positioning):  1 = Assistance needed to correctly position infant at breast and maintain latch for depth   LATCH score:  7   Attached assessment:  Deep  Lips flanged:  Yes.    Lips untucked:  Yes.    Suck assessment:  Nonnutritive  Tools:  None  Instructed on use and cleaning of tool:  No.  Pre-feed weight:  3292 g  , 7-4.1 oz  Post-feed weight:  3298g, 7-4.3 oz  Amount transferred:  6 ml  Amount supplemented: none needed    Total amount pumped post feed:  Did not post pump   Total amount transferred:  60 ml  Total supplement given:  None    Lactation Impression: Baby "Krystal Taylor " is 57 days old  By the sounds was BF well in the [facility] and went home when the milk came in  Started having difficulty latching well , voids and stools decreased according to mom  And and to be readmitted for full assessment and work up. With supplementing of formula  Voids an stools improved and baby seemed to have more energy and eat better.  Also a contributing factor possible could have been the jaundice increasing causing sluggishness with feedings.  Also due to the short posterior frenulum LC suspects probably was causing an issue with a deep latch for adequate  Milk transfer for weight gain , hydration.  Mom is here today to -relatch and be reassured breast feeding will work , because she really wants to breast feed  And is committed to get assistance Baby latched well with assistance with depth . And review of steps of breast feeding latch .  Mom and Dad receptive to teaching ( and dad willing to assist mom like a LC )  Baby latched both breast and transferred 60  ml at 6 days out , excellent transfer , breast softened. LC reassured mom and dad the amount ( 60 ml ) , baby Krystal transferred off the breast was excellent for a 6 DAY OLD BABY.  See [facility] plan below for details.-   Lactation Plan of Care:  Per mom F/U with Dr. Norris Cross 7/7 pm for weight check  Praised mom for her efforts breast  Feeding  Mom - rest , naps , plenty fluids , esp water , nutritious snacks and meals  Steps for latching - breast massage, hand express, ( if to full express off 7-10 ml )  Areola needs to be compressible like a sandwich ( thin before latch to obtain depth )  Latch with firm support with breast compressions until swallows , and then intermittent  When latching - point your nipple towards Krystal's nose , and tap upper lip , wait for wide open mouth  Latch with compressions. Average feeding time 15 -20  mins 1st breast , if in a good swallowing pattern let him finish . Along is he isn't hanging out.  Engorgement prevention and tx reviewed , referring to the Baby and me booklet pages 24 , storage of breast milk pages 25 .  Since Krystal Taylor has already been introduced to bottles , one feeding a day , let dad feed a bottle 60 ml and mom pumps both breast 15 -20 mins at the same time. Save milk. F/U LC O/P apt for next Friday July 15 at 230 pm at [facility] .

## 2015-03-09 ENCOUNTER — Ambulatory Visit (HOSPITAL_COMMUNITY): Payer: BLUE CROSS/BLUE SHIELD

## 2015-03-11 ENCOUNTER — Ambulatory Visit (HOSPITAL_COMMUNITY): Payer: BLUE CROSS/BLUE SHIELD

## 2015-03-16 ENCOUNTER — Ambulatory Visit (HOSPITAL_COMMUNITY): Admission: RE | Admit: 2015-03-16 | Payer: BLUE CROSS/BLUE SHIELD | Source: Ambulatory Visit

## 2016-11-21 ENCOUNTER — Other Ambulatory Visit: Payer: Self-pay | Admitting: Obstetrics

## 2016-11-21 LAB — OB RESULTS CONSOLE RPR: RPR: NONREACTIVE

## 2016-11-21 LAB — OB RESULTS CONSOLE HEPATITIS B SURFACE ANTIGEN: Hepatitis B Surface Ag: NEGATIVE

## 2016-11-21 LAB — OB RESULTS CONSOLE RUBELLA ANTIBODY, IGM: Rubella: IMMUNE

## 2016-11-21 LAB — OB RESULTS CONSOLE HIV ANTIBODY (ROUTINE TESTING): HIV: NONREACTIVE

## 2016-11-21 LAB — OB RESULTS CONSOLE ANTIBODY SCREEN: ANTIBODY SCREEN: NEGATIVE

## 2016-11-21 LAB — OB RESULTS CONSOLE GC/CHLAMYDIA
CHLAMYDIA, DNA PROBE: NEGATIVE
Gonorrhea: NEGATIVE

## 2016-11-23 LAB — CYTOLOGY - PAP

## 2017-05-29 LAB — OB RESULTS CONSOLE GBS: STREP GROUP B AG: POSITIVE

## 2017-06-07 ENCOUNTER — Encounter (HOSPITAL_COMMUNITY): Payer: Self-pay | Admitting: *Deleted

## 2017-06-20 ENCOUNTER — Encounter (HOSPITAL_COMMUNITY)
Admission: RE | Admit: 2017-06-20 | Discharge: 2017-06-20 | Disposition: A | Discharge status: Home / Self Care | Payer: BLUE CROSS/BLUE SHIELD | Source: Ambulatory Visit | Attending: Obstetrics | Admitting: Obstetrics

## 2017-06-20 HISTORY — DX: Personal history of other infectious and parasitic diseases: Z86.19

## 2017-06-20 LAB — CBC
HCT: 31.8 % — ABNORMAL LOW (ref 36.0–46.0)
HEMOGLOBIN: 11 g/dL — AB (ref 12.0–15.0)
MCH: 27.6 pg (ref 26.0–34.0)
MCHC: 34.6 g/dL (ref 30.0–36.0)
MCV: 79.9 fL (ref 78.0–100.0)
PLATELETS: 275 10*3/uL (ref 150–400)
RBC: 3.98 MIL/uL (ref 3.87–5.11)
RDW: 14.7 % (ref 11.5–15.5)
WBC: 9 10*3/uL (ref 4.0–10.5)

## 2017-06-20 LAB — TYPE AND SCREEN
ABO/RH(D): A POS
ANTIBODY SCREEN: NEGATIVE

## 2017-06-20 NOTE — H&P (Signed)
31 y.o. G2P1000 @ 7759w2d presents for repeat cesarean section.  Otherwise has good fetal movement and no bleeding.  Past Medical History:  Diagnosis Date  . Hx of varicella   . Medical history non-contributory     Past Surgical History:  Procedure Laterality Date  . CESAREAN SECTION N/A 02/25/2015   Procedure: CESAREAN SECTION;  Surgeon: Waynard ReedsKendra Ross, MD;  Location: [facility];  Service: Obstetrics;  Laterality: N/A;  . WISDOM TOOTH EXTRACTION      OB History  Gravida Para Term Preterm AB Living  2 1 1     1   SAB TAB Ectopic Multiple Live Births        0      # Outcome Date GA Lbr Len/2nd Weight Sex Delivery Anes PTL Lv  2 Current           1 Term 02/25/15 7766w3d 06:00 / 04:59 3.289 kg (7 lb 4 oz) M CS-LTranv Local, EPI        Social History   Social History  . Marital status: Married    Spouse name: N/A  . Number of children: N/A  . Years of education: N/A   Occupational History  . Not on file.   Social History Main Topics  . Smoking status: Never Smoker  . Smokeless tobacco: Never Used  . Alcohol use No  . Drug use: No  . Sexual activity: Yes   Other Topics Concern  . Not on file   Social History Narrative  . No narrative on file   Patient has no known allergies.    Prenatal Transfer Tool  Maternal Diabetes: No Genetic Screening: Declined Maternal Ultrasounds/Referrals: Normal Fetal Ultrasounds or other Referrals:  None Maternal Substance Abuse:  No Significant Maternal Medications:  None Significant Maternal Lab Results: None  ABO, Rh: --/--/A POS (10/25 1128) Antibody: NEG (10/25 1128) Rubella: Immune (03/28 0000) RPR: Non Reactive (10/25 1128)  HBsAg: Negative (03/28 0000)  HIV: Non-reactive (03/28 0000)  GBS: Positive (10/03 0000)    Other PNC: uncomplicated.    Vitals:   06/21/17 0727  BP: 116/85  Pulse: 90  Resp: 18  Temp: 98.3 F (36.8 C)     General:  NAD Abdomen:  soft, gravid Ex:  no edema FHTs:  150s    A/P   31 y.o. G2P1000  7759w2d presents for repeat cesarean section.  Discussed risks of cesarean section to include, but not limited to, infection, bleeding, damage to surrounding strutcures (including bowel, bladder, tubes, ovaries, nerves, vessels, baby), need for additional procedures such as repeat cesarean section or hysterecomty, risk of blood clot, need for transfusion. Consent signed Ancef 2gm on call to OR  [facility] Hospital[facility]ANNA GEFFEL Chestine SporeLARK

## 2017-06-20 NOTE — Patient Instructions (Signed)
Krystal RodriguezKelly Gaige  06/20/2017   Your procedure is scheduled on:  06/21/2017  [facility] through the [facility]tr[facility] of [facility] at 0645 AM.  Pick up the phone at the desk and dial [phone]  Call this number if you have problems the morning of [phone]  Remember:   Do not eat food:After Midnight.  Do not drink clear liquids: After Midnight.  Take these medicines the morning of surgery with A SIP OF WATER: none   Do not wear jewelry, make-up or nail polish.  Do not wear lotions, powders, or perfumes. Do not wear deodorant.  Do not shave 48 hours prior to surgery.  Do not bring valuables to the hospital.  [facility] is not   responsible for any belongings or valuables brought to the hospital.  Contacts, dentures or bridgework may not be worn into surgery.  Leave suitcase in the car. After surgery it may be brought to your room.  For patients admitted to the hospital, checkout time is 11:00 AM the day of              discharge.    N/A   Please read over the following fact sheets that you were given:   Surgical Site Infection Prevention

## 2017-06-21 ENCOUNTER — Inpatient Hospital Stay (HOSPITAL_COMMUNITY)
Admission: RE | Admit: 2017-06-21 | Discharge: 2017-06-23 | DRG: 788 | Disposition: A | Discharge status: Home / Self Care | Payer: BLUE CROSS/BLUE SHIELD | Source: Ambulatory Visit | Attending: Obstetrics | Admitting: Obstetrics

## 2017-06-21 ENCOUNTER — Encounter (HOSPITAL_COMMUNITY): Payer: Self-pay | Admitting: Certified Registered Nurse Anesthetist

## 2017-06-21 ENCOUNTER — Inpatient Hospital Stay (HOSPITAL_COMMUNITY): Payer: BLUE CROSS/BLUE SHIELD | Admitting: Anesthesiology

## 2017-06-21 ENCOUNTER — Encounter (HOSPITAL_COMMUNITY)
Admission: RE | Disposition: A | Discharge status: Home / Self Care | Payer: Self-pay | Source: Ambulatory Visit | Attending: Obstetrics

## 2017-06-21 DIAGNOSIS — O34211 Maternal care for low transverse scar from previous cesarean delivery: Principal | ICD-10-CM | POA: Diagnosis present

## 2017-06-21 DIAGNOSIS — O99824 Streptococcus B carrier state complicating childbirth: Secondary | ICD-10-CM | POA: Diagnosis present

## 2017-06-21 DIAGNOSIS — O34219 Maternal care for unspecified type scar from previous cesarean delivery: Secondary | ICD-10-CM | POA: Diagnosis present

## 2017-06-21 DIAGNOSIS — Z3A39 39 weeks gestation of pregnancy: Secondary | ICD-10-CM

## 2017-06-21 LAB — RPR: RPR: NONREACTIVE

## 2017-06-21 SURGERY — Surgical Case
Anesthesia: Spinal | Wound class: Clean Contaminated

## 2017-06-21 MED ORDER — SCOPOLAMINE 1 MG/3DAYS TD PT72
1.0000 | MEDICATED_PATCH | Freq: Once | TRANSDERMAL | Status: DC
  Administered 2017-06-21: 1.5 mg via TRANSDERMAL

## 2017-06-21 MED ORDER — MORPHINE SULFATE (PF) 0.5 MG/ML IJ SOLN
INTRAMUSCULAR | Status: AC
  Filled 2017-06-21: qty 10

## 2017-06-21 MED ORDER — OXYCODONE HCL 5 MG/5ML PO SOLN
10.0000 mg | ORAL | Status: DC | PRN
  Administered 2017-06-22: 10 mg via ORAL
  Filled 2017-06-21: qty 10

## 2017-06-21 MED ORDER — NALBUPHINE HCL 10 MG/ML IJ SOLN: 5.0000 mg | INTRAMUSCULAR | Status: DC | PRN

## 2017-06-21 MED ORDER — MEPERIDINE HCL 25 MG/ML IJ SOLN: 6.2500 mg | INTRAMUSCULAR | Status: DC | PRN

## 2017-06-21 MED ORDER — FENTANYL CITRATE (PF) 100 MCG/2ML IJ SOLN
25.0000 ug | INTRAMUSCULAR | Status: DC | PRN
  Administered 2017-06-21: 50 ug via INTRAVENOUS

## 2017-06-21 MED ORDER — DIBUCAINE 1 % RE OINT: 1.0000 "application " | TOPICAL_OINTMENT | RECTAL | Status: DC | PRN

## 2017-06-21 MED ORDER — FENTANYL CITRATE (PF) 100 MCG/2ML IJ SOLN
INTRAMUSCULAR | Status: DC | PRN
  Administered 2017-06-21: 10 ug via INTRAVENOUS

## 2017-06-21 MED ORDER — DIPHENHYDRAMINE HCL 25 MG PO CAPS: 25.0000 mg | ORAL_CAPSULE | ORAL | Status: DC | PRN

## 2017-06-21 MED ORDER — BUPIVACAINE IN DEXTROSE 0.75-8.25 % IT SOLN
INTRATHECAL | Status: DC | PRN
  Administered 2017-06-21: 1.6 mL via INTRATHECAL

## 2017-06-21 MED ORDER — OXYCODONE HCL 5 MG PO TABS: 5.0000 mg | ORAL_TABLET | ORAL | Status: DC | PRN

## 2017-06-21 MED ORDER — NALOXONE HCL 0.4 MG/ML IJ SOLN: 0.4000 mg | INTRAMUSCULAR | Status: DC | PRN

## 2017-06-21 MED ORDER — NALOXONE HCL 2 MG/2ML IJ SOSY
1.0000 ug/kg/h | PREFILLED_SYRINGE | INTRAVENOUS | Status: DC | PRN
  Filled 2017-06-21: qty 2

## 2017-06-21 MED ORDER — IBUPROFEN 600 MG PO TABS: 600.0000 mg | ORAL_TABLET | Freq: Four times a day (QID) | ORAL | Status: DC

## 2017-06-21 MED ORDER — ONDANSETRON HCL 4 MG/2ML IJ SOLN
INTRAMUSCULAR | Status: DC | PRN
  Administered 2017-06-21: 4 mg via INTRAVENOUS

## 2017-06-21 MED ORDER — ACETAMINOPHEN 160 MG/5ML PO SOLN
650.0000 mg | ORAL | Status: DC | PRN
  Administered 2017-06-22 – 2017-06-23 (×7): 650 mg via ORAL
  Filled 2017-06-21 (×7): qty 20.3

## 2017-06-21 MED ORDER — MORPHINE SULFATE (PF) 0.5 MG/ML IJ SOLN
INTRAMUSCULAR | Status: DC | PRN
  Administered 2017-06-21: .2 mg via EPIDURAL

## 2017-06-21 MED ORDER — DIPHENHYDRAMINE HCL 25 MG PO CAPS
25.0000 mg | ORAL_CAPSULE | Freq: Four times a day (QID) | ORAL | Status: DC | PRN
Start: 2017-06-21 — End: 2017-06-23

## 2017-06-21 MED ORDER — SCOPOLAMINE 1 MG/3DAYS TD PT72
MEDICATED_PATCH | TRANSDERMAL | Status: AC
Start: 2017-06-21 — End: 2017-06-21
  Filled 2017-06-21: qty 1

## 2017-06-21 MED ORDER — WITCH HAZEL-GLYCERIN EX PADS: 1.0000 "application " | MEDICATED_PAD | CUTANEOUS | Status: DC | PRN

## 2017-06-21 MED ORDER — BUPIVACAINE IN DEXTROSE 0.75-8.25 % IT SOLN
INTRATHECAL | Status: AC
  Filled 2017-06-21: qty 2

## 2017-06-21 MED ORDER — TETANUS-DIPHTH-ACELL PERTUSSIS 5-2.5-18.5 LF-MCG/0.5 IM SUSP: 0.5000 mL | Freq: Once | INTRAMUSCULAR | Status: DC

## 2017-06-21 MED ORDER — DEXAMETHASONE SODIUM PHOSPHATE 4 MG/ML IJ SOLN
INTRAMUSCULAR | Status: AC
  Filled 2017-06-21: qty 1

## 2017-06-21 MED ORDER — NALBUPHINE HCL 10 MG/ML IJ SOLN: 5.0000 mg | Freq: Once | INTRAMUSCULAR | Status: DC | PRN

## 2017-06-21 MED ORDER — ONDANSETRON HCL 4 MG/2ML IJ SOLN
INTRAMUSCULAR | Status: AC
  Filled 2017-06-21: qty 2

## 2017-06-21 MED ORDER — OXYCODONE HCL 5 MG/5ML PO SOLN
5.0000 mg | ORAL | Status: DC | PRN
  Administered 2017-06-22 – 2017-06-23 (×3): 5 mg via ORAL
  Filled 2017-06-21 (×3): qty 5

## 2017-06-21 MED ORDER — ONDANSETRON HCL 4 MG/2ML IJ SOLN: 4.0000 mg | Freq: Three times a day (TID) | INTRAMUSCULAR | Status: DC | PRN

## 2017-06-21 MED ORDER — DIPHENHYDRAMINE HCL 50 MG/ML IJ SOLN: 12.5000 mg | INTRAMUSCULAR | Status: DC | PRN

## 2017-06-21 MED ORDER — PHENYLEPHRINE 8 MG IN D5W 100 ML (0.08MG/ML) PREMIX OPTIME
INJECTION | INTRAVENOUS | Status: DC | PRN
  Administered 2017-06-21: 60 ug/min via INTRAVENOUS

## 2017-06-21 MED ORDER — SODIUM CHLORIDE 0.9% FLUSH: 3.0000 mL | INTRAVENOUS | Status: DC | PRN

## 2017-06-21 MED ORDER — SCOPOLAMINE 1 MG/3DAYS TD PT72
MEDICATED_PATCH | TRANSDERMAL | Status: AC
  Filled 2017-06-21: qty 1

## 2017-06-21 MED ORDER — OXYTOCIN 40 UNITS IN LACTATED RINGERS INFUSION - SIMPLE MED: 2.5000 [IU]/h | INTRAVENOUS | Status: AC

## 2017-06-21 MED ORDER — COMPLETENATE 29-1 MG PO CHEW
1.0000 | CHEWABLE_TABLET | Freq: Every day | ORAL | Status: DC
  Administered 2017-06-21 – 2017-06-23 (×2): 1 via ORAL
  Filled 2017-06-21 (×3): qty 1

## 2017-06-21 MED ORDER — SIMETHICONE 80 MG PO CHEW: 80.0000 mg | CHEWABLE_TABLET | ORAL | Status: DC | PRN

## 2017-06-21 MED ORDER — LACTATED RINGERS IV SOLN
INTRAVENOUS | Status: DC
  Administered 2017-06-21 (×3): via INTRAVENOUS

## 2017-06-21 MED ORDER — SENNOSIDES-DOCUSATE SODIUM 8.6-50 MG PO TABS
2.0000 | ORAL_TABLET | ORAL | Status: DC
  Administered 2017-06-21 – 2017-06-22 (×2): 2 via ORAL
  Filled 2017-06-21 (×2): qty 2

## 2017-06-21 MED ORDER — IBUPROFEN 100 MG/5ML PO SUSP
600.0000 mg | Freq: Four times a day (QID) | ORAL | Status: DC
  Administered 2017-06-21 – 2017-06-23 (×8): 600 mg via ORAL
  Filled 2017-06-21 (×12): qty 30

## 2017-06-21 MED ORDER — OXYTOCIN 10 UNIT/ML IJ SOLN
INTRAMUSCULAR | Status: AC
  Filled 2017-06-21: qty 4

## 2017-06-21 MED ORDER — DIPHENHYDRAMINE HCL 50 MG/ML IJ SOLN
INTRAMUSCULAR | Status: AC
  Filled 2017-06-21: qty 1

## 2017-06-21 MED ORDER — SIMETHICONE 80 MG PO CHEW
80.0000 mg | CHEWABLE_TABLET | ORAL | Status: DC
  Administered 2017-06-21 – 2017-06-22 (×2): 80 mg via ORAL
  Filled 2017-06-21 (×2): qty 1

## 2017-06-21 MED ORDER — PRENATAL MULTIVITAMIN CH: 1.0000 | ORAL_TABLET | Freq: Every day | ORAL | Status: DC

## 2017-06-21 MED ORDER — FENTANYL CITRATE (PF) 100 MCG/2ML IJ SOLN
INTRAMUSCULAR | Status: AC
  Filled 2017-06-21: qty 2

## 2017-06-21 MED ORDER — SODIUM CHLORIDE 0.9 % IR SOLN
Status: DC | PRN
  Administered 2017-06-21: 1

## 2017-06-21 MED ORDER — ACETAMINOPHEN 325 MG PO TABS: 650.0000 mg | ORAL_TABLET | ORAL | Status: DC | PRN

## 2017-06-21 MED ORDER — MENTHOL 3 MG MT LOZG: 1.0000 | LOZENGE | OROMUCOSAL | Status: DC | PRN

## 2017-06-21 MED ORDER — OXYTOCIN 40 UNITS IN LACTATED RINGERS INFUSION - SIMPLE MED
INTRAVENOUS | Status: DC | PRN
  Administered 2017-06-21: 40 mL via INTRAVENOUS

## 2017-06-21 MED ORDER — LACTATED RINGERS IV SOLN
INTRAVENOUS | Status: DC
  Administered 2017-06-21 (×2): via INTRAVENOUS

## 2017-06-21 MED ORDER — LACTATED RINGERS IV SOLN
INTRAVENOUS | Status: DC | PRN
  Administered 2017-06-21: 09:00:00 via INTRAVENOUS

## 2017-06-21 MED ORDER — COCONUT OIL OIL: 1.0000 "application " | TOPICAL_OIL | Status: DC | PRN

## 2017-06-21 MED ORDER — CEFAZOLIN SODIUM-DEXTROSE 2-3 GM-%(50ML) IV SOLR
INTRAVENOUS | Status: AC
  Filled 2017-06-21: qty 50

## 2017-06-21 MED ORDER — PHENYLEPHRINE 8 MG IN D5W 100 ML (0.08MG/ML) PREMIX OPTIME
INJECTION | INTRAVENOUS | Status: AC
Start: 2017-06-21 — End: 2017-06-21
  Filled 2017-06-21: qty 100

## 2017-06-21 MED ORDER — PHENYLEPHRINE 8 MG IN D5W 100 ML (0.08MG/ML) PREMIX OPTIME
INJECTION | INTRAVENOUS | Status: AC
  Filled 2017-06-21: qty 100

## 2017-06-21 MED ORDER — SIMETHICONE 80 MG PO CHEW
80.0000 mg | CHEWABLE_TABLET | Freq: Three times a day (TID) | ORAL | Status: DC
  Administered 2017-06-21 – 2017-06-23 (×5): 80 mg via ORAL
  Filled 2017-06-21 (×2): qty 1

## 2017-06-21 MED ORDER — OXYCODONE HCL 5 MG PO TABS: 10.0000 mg | ORAL_TABLET | ORAL | Status: DC | PRN

## 2017-06-21 MED ORDER — CEFAZOLIN SODIUM-DEXTROSE 2-4 GM/100ML-% IV SOLN
2.0000 g | INTRAVENOUS | Status: AC
  Administered 2017-06-21: 2 g via INTRAVENOUS
  Filled 2017-06-21: qty 100

## 2017-06-21 MED ORDER — DEXAMETHASONE SODIUM PHOSPHATE 10 MG/ML IJ SOLN
INTRAMUSCULAR | Status: DC | PRN
  Administered 2017-06-21: 4 mg via INTRAVENOUS

## 2017-06-21 SURGICAL SUPPLY — 36 items
BENZOIN TINCTURE PRP APPL 2/3 (GAUZE/BANDAGES/DRESSINGS) ×3 IMPLANT
CHLORAPREP W/TINT 26ML (MISCELLANEOUS) ×3 IMPLANT
CLAMP CORD UMBIL (MISCELLANEOUS) IMPLANT
CLOSURE WOUND 1/2 X4 (GAUZE/BANDAGES/DRESSINGS) ×1
CLOTH BEACON ORANGE TIMEOUT ST (SAFETY) ×3 IMPLANT
DRSG OPSITE POSTOP 4X10 (GAUZE/BANDAGES/DRESSINGS) ×3 IMPLANT
ELECT REM PT RETURN 9FT ADLT (ELECTROSURGICAL) ×3
ELECTRODE REM PT RTRN 9FT ADLT (ELECTROSURGICAL) ×1 IMPLANT
EXTRACTOR VACUUM KIWI (MISCELLANEOUS) IMPLANT
GLOVE BIOGEL PI IND STRL 6.5 (GLOVE) ×1 IMPLANT
GLOVE BIOGEL PI IND STRL 7.0 (GLOVE) ×1 IMPLANT
GLOVE BIOGEL PI INDICATOR 6.5 (GLOVE) ×2
GLOVE BIOGEL PI INDICATOR 7.0 (GLOVE) ×2
GLOVE ECLIPSE 6.0 STRL STRAW (GLOVE) ×3 IMPLANT
GOWN STRL REUS W/TWL LRG LVL3 (GOWN DISPOSABLE) ×6 IMPLANT
KIT ABG SYR 3ML LUER SLIP (SYRINGE) IMPLANT
NEEDLE HYPO 25X5/8 SAFETYGLIDE (NEEDLE) IMPLANT
NS IRRIG 1000ML POUR BTL (IV SOLUTION) ×3 IMPLANT
PACK C SECTION WH (CUSTOM PROCEDURE TRAY) ×3 IMPLANT
PAD OB MATERNITY 4.3X12.25 (PERSONAL CARE ITEMS) ×3 IMPLANT
PENCIL SMOKE EVAC W/HOLSTER (ELECTROSURGICAL) ×3 IMPLANT
RETAINER VISCERAL (MISCELLANEOUS) ×3 IMPLANT
RTRCTR C-SECT PINK 25CM LRG (MISCELLANEOUS) ×3 IMPLANT
STRIP CLOSURE SKIN 1/2X4 (GAUZE/BANDAGES/DRESSINGS) ×2 IMPLANT
SUT MNCRL 0 VIOLET CTX 36 (SUTURE) ×2 IMPLANT
SUT MNCRL AB 3-0 PS2 27 (SUTURE) ×3 IMPLANT
SUT MONOCRYL 0 CTX 36 (SUTURE) ×4
SUT PLAIN 0 NONE (SUTURE) IMPLANT
SUT PLAIN 2 0 (SUTURE) ×4
SUT PLAIN ABS 2-0 CT1 27XMFL (SUTURE) ×2 IMPLANT
SUT VIC AB 0 CTX 36 (SUTURE) ×4
SUT VIC AB 0 CTX36XBRD ANBCTRL (SUTURE) ×2 IMPLANT
SUT VIC AB 2-0 CT1 27 (SUTURE) ×2
SUT VIC AB 2-0 CT1 TAPERPNT 27 (SUTURE) ×1 IMPLANT
TOWEL OR 17X24 6PK STRL BLUE (TOWEL DISPOSABLE) ×3 IMPLANT
TRAY FOLEY BAG SILVER LF 14FR (SET/KITS/TRAYS/PACK) ×3 IMPLANT

## 2017-06-21 NOTE — Anesthesia Preprocedure Evaluation (Signed)
Anesthesia Evaluation  Patient identified by MRN, date of birth, ID band Patient awake    Reviewed: Allergy & Precautions, NPO status , Patient's Chart, lab work & pertinent test results  History of Anesthesia Complications Negative for: history of anesthetic complications  Airway Mallampati: II  TM Distance: >3 FB Neck ROM: Full    Dental  (+) Teeth Intact   Pulmonary neg pulmonary ROS,    breath sounds clear to auscultation       Cardiovascular negative cardio ROS   Rhythm:Regular     Neuro/Psych negative neurological ROS  negative psych ROS   GI/Hepatic negative GI ROS, Neg liver ROS,   Endo/Other  negative endocrine ROS  Renal/GU negative Renal ROS     Musculoskeletal   Abdominal   Peds  Hematology negative hematology ROS (+)   Anesthesia Other Findings   Reproductive/Obstetrics (+) Pregnancy                             Anesthesia Physical Anesthesia Plan  ASA: II  Anesthesia Plan: Spinal   Post-op Pain Management:    Induction:   PONV Risk Score and Plan: 2 and Ondansetron and Dexamethasone  Airway Management Planned: Nasal Cannula  Additional Equipment:   Intra-op Plan:   Post-operative Plan:   Informed Consent: I have reviewed the patients History and Physical, chart, labs and discussed the procedure including the risks, benefits and alternatives for the proposed anesthesia with the patient or authorized representative who has indicated his/her understanding and acceptance.   Dental advisory given  Plan Discussed with: CRNA and Surgeon  Anesthesia Plan Comments:         Anesthesia Quick Evaluation

## 2017-06-21 NOTE — Plan of Care (Signed)
Problem: Activity: Goal: Risk for activity intolerance will decrease Discussed plan of ambulation with patient and encouraged patient not to get out of bed without staff assistance. Discussed when standing at side of bed and ambulating to bathroom would approximately be. Encouraged patient to eat some food prior to ambulating.    Problem: Nutritional: Goal: Mothers verbalization of comfort with breastfeeding process will improve Patient plans to only bottle feed baby.

## 2017-06-21 NOTE — Brief Op Note (Signed)
06/21/2017  9:39 AM  PATIENT:  Ricki RodriguezKelly Koop  31 y.o. female  PRE-OPERATIVE DIAGNOSIS:  REPEAT EDD: 06/25/17 NKDA  POST-OPERATIVE DIAGNOSIS:  REPEAT EDD: 06/25/17  PROCEDURE:  Procedure(s): REPEAT CESAREAN SECTION (N/A)  SURGEON:  Surgeon(s) and Role:    Marlow Baars* Riot Waterworth, MD - Primary Ilda MoriKaplan, Richard, MD - Assist  ANESTHESIA:   spinal  EBL:  854 mL   BLOOD ADMINISTERED:none  DRAINS: none   LOCAL MEDICATIONS USED:  NONE  SPECIMEN:  No Specimen  DISPOSITION OF SPECIMEN:  N/A  COUNTS:  YES  TOURNIQUET:  * No tourniquets in log *  DICTATION: .Note written in EPIC  PLAN OF CARE: Admit to inpatient   PATIENT DISPOSITION:  PACU - hemodynamically stable.   Delay start of Pharmacological VTE agent (>24hrs) due to surgical blood loss or risk of bleeding: not applicable

## 2017-06-21 NOTE — Transfer of Care (Signed)
Immediate Anesthesia Transfer of Care Note  Patient: Krystal RodriguezKelly Taylor  Procedure(s) Performed: REPEAT CESAREAN SECTION (N/A )  Patient Location: PACU  Anesthesia Type:Spinal  Level of Consciousness: awake, alert  and oriented  Airway & Oxygen Therapy: Patient Spontanous Breathing  Post-op Assessment: Report given to RN and Post -op Vital signs reviewed and stable  Post vital signs: Reviewed and stable  Last Vitals:  Vitals:   06/21/17 0727 06/21/17 0948  BP: 116/85 112/72  Pulse: 90 72  Resp: 18   Temp: 36.8 C   SpO2:  99%    Last Pain:  Vitals:   06/21/17 0727  TempSrc: Oral      Patients Stated Pain Goal: 4 (06/21/17 0727)  Complications: No apparent anesthesia complications

## 2017-06-21 NOTE — Plan of Care (Signed)
Problem: Education: Goal: Knowledge of condition will improve Admission education, safety and unit protocols reviewed with patient and significant other.    

## 2017-06-21 NOTE — Anesthesia Postprocedure Evaluation (Signed)
Anesthesia Post Note  Patient: Krystal Taylor  Procedure(s) Performed: REPEAT CESAREAN SECTION (N/A )     Patient location during evaluation: Mother Baby Anesthesia Type: Spinal Level of consciousness: awake Pain management: satisfactory to patient Vital Signs Assessment: post-procedure vital signs reviewed and stable Respiratory status: spontaneous breathing Cardiovascular status: stable Anesthetic complications: no    Last Vitals:  Vitals:   06/21/17 1300 06/21/17 1400  BP: 101/64 110/68  Pulse: 69 68  Resp: 16 17  Temp: 36.5 C 36.8 C  SpO2: 99% 100%    Last Pain:  Vitals:   06/21/17 1400  TempSrc: Oral  PainSc: 2    Pain Goal: Patients Stated Pain Goal: 3 (06/21/17 1400)               Cephus ShellingBURGER,Azell Bill

## 2017-06-21 NOTE — Op Note (Signed)
Cesarean Section Procedure Note  Pre-operative Diagnosis: 1. Intrauterine pregnancy at 2957w3d  2. History of cesarean section, desires repeat  Post-operative Diagnosis: same as above  Surgeon: Marlow Baarsyanna Malvina Schadler, MD  Assistants: Ilda Moriichard Kaplan, MD  Procedure: Repeat low transverse cesarean section   Anesthesia: Spinal anesthesia  Estimated Blood Loss: 854 mL         Drains: Foley catheter         Specimens: None              Complications:  None; patient tolerated the procedure well.         Disposition: PACU - hemodynamically stable.  Findings:  Normal uterus, tubes and ovaries bilaterally.  Moderate adhesions of the subcutaneous layer and of the rectus muscles to anterior abdominal wall.  No intraperitoneal adhesions. Viable female infant, weight pending (please see delivery summary) Apgars 7, 8.    Procedure Details   After spinal anesthesia was found to adequate , the patient was placed in the dorsal supine position with a leftward tilt, draped and prepped in the usual sterile manner. A Pfannenstiel incision was made and carried down through the subcutaneous tissue to the fascia. The fascia was incised in the midline and the fascial incision was extended laterally with Mayo scissors. The superior aspect of the fascial incision was grasped with two Kocher clamp, tented up and the rectus muscles dissected off sharply. The rectus was then dissected off with blunt dissection and Mayo scissors inferiorly. The rectus muscles were separated in the midline. The abdominal peritoneum was identified, tented up, entered sharply, and the incision was extended superiorly and inferiorly with good visualization of the bladder. The Alexis retractor was deployed. The vesicouterine peritoneum was identified and the bladder was noted to be well away from the lower uterine segment.  A scalpel was then used to make a low transverse incision on the uterus which was extended in the cephalad-caudad direction with  blunt dissection. The fluid was clear. The fetal vertex was identified, elevated out of the pelvis and brought to the hysterotomy. The head was delivered easily followed by the shoulders and body. The cord was clamped and cut and the infant was passed to the waiting neonatologist. Placenta was then delivered spontaneously, intact and appear normal, the uterus was cleared of all clot and debris   The hysterotomy was repaired with #0 Monocryl in running locked fashion.  There was a small amount of bleeding from the uterine serosa that was controlled with bovie cautery.  The hysterotomy was reexamined and excellent hemostasis was noted.  The Alexis retractor was removed from the abdomen. The peritoneum was examined and all vessels noted to be hemostatic. The abdominal cavity was cleared of all clot and debris.  The peritoneum was closed with 2-0 vicryl in a running fashion. The fascia and rectus muscles were inspected and were hemostatic. The fascia was closed with 0 Vicryl in a running fashion. The subcuticular layer was irrigated and all bleeders cauterized.  The subcutaneous layer was re approximated with interrupted 3-0 plain gut.  The skin was closed with 3-0 monocryl in a subcuticular fashion. The incision was dressed with benzoine, steri strips and honeycomb dressing. All sponge lap and needle counts were correct x3. Patient tolerated the procedure well and recovered in stable condition following the procedure.

## 2017-06-21 NOTE — Anesthesia Procedure Notes (Signed)
Spinal  Patient location during procedure: OR Start time: 06/21/2017 8:24 AM End time: 06/21/2017 8:30 AM Staffing Anesthesiologist: Val EagleMOSER, Hamna Asa Preanesthetic Checklist Completed: patient identified, surgical consent, pre-op evaluation, timeout performed, IV checked, risks and benefits discussed and monitors and equipment checked Spinal Block Patient position: sitting Prep: site prepped and draped and DuraPrep Patient monitoring: heart rate, cardiac monitor, continuous pulse ox and blood pressure Approach: midline Location: L3-4 Injection technique: single-shot Needle Needle type: Pencan  Needle gauge: 24 G Needle length: 10 cm Assessment Sensory level: T4

## 2017-06-22 LAB — BIRTH TISSUE RECOVERY COLLECTION (PLACENTA DONATION)

## 2017-06-22 LAB — CBC
HEMATOCRIT: 27.2 % — AB (ref 36.0–46.0)
Hemoglobin: 9.4 g/dL — ABNORMAL LOW (ref 12.0–15.0)
MCH: 28 pg (ref 26.0–34.0)
MCHC: 34.6 g/dL (ref 30.0–36.0)
MCV: 81 fL (ref 78.0–100.0)
Platelets: 206 10*3/uL (ref 150–400)
RBC: 3.36 MIL/uL — ABNORMAL LOW (ref 3.87–5.11)
RDW: 14.7 % (ref 11.5–15.5)
WBC: 11.2 10*3/uL — ABNORMAL HIGH (ref 4.0–10.5)

## 2017-06-22 NOTE — Progress Notes (Signed)
Subjective: Postpartum Day 1: Cesarean Delivery Patient reports pain controlled, no nausea or vomiting. Voiding without difficulty  Objective: Vital signs in last 24 hours: Temp:  [97.6 F (36.4 C)-98.5 F (36.9 C)] 98 F (36.7 C) (10/27 0830) Pulse Rate:  [57-84] 67 (10/27 0830) Resp:  [15-20] 18 (10/27 0830) BP: (68-110)/(44-68) 101/53 (10/27 0830) SpO2:  [97 %-100 %] 99 % (10/27 0830)  Physical Exam:  General: alert, cooperative and appears stated age Lochia: appropriate Uterine Fundus: firm Incision: healing well DVT Evaluation: No evidence of DVT seen on physical exam.   Recent Labs  06/20/17 1128 06/22/17 0524  HGB 11.0* 9.4*  HCT 31.8* 27.2*    Assessment/Plan: Status post Cesarean section. Doing well postoperatively.  Continue current care.  Manuelito Poage H. 06/22/2017, 11:24 AM

## 2017-06-22 NOTE — Progress Notes (Addendum)
On-call physician Dr Tenny Crawoss informed of pt's low blood pressures, no new orders at this time as pt is asymptomatic   At the time of the blood pressure being 69/44, patient's pulse ox was reading in the high nineties, she was oriented X4 and completely coherent, stated she felt cold but was not pale, shaking, or otherwise symptomatic of hypovolemia.

## 2017-06-23 MED ORDER — IBUPROFEN 600 MG PO TABS: 600.0000 mg | ORAL_TABLET | Freq: Four times a day (QID) | ORAL | 0 refills | Status: DC | PRN

## 2017-06-23 MED ORDER — DOCUSATE SODIUM 100 MG PO CAPS: 100.0000 mg | ORAL_CAPSULE | Freq: Two times a day (BID) | ORAL | 0 refills | Status: DC

## 2017-06-23 MED ORDER — OXYCODONE-ACETAMINOPHEN 5-325 MG PO TABS: 1.0000 | ORAL_TABLET | ORAL | 0 refills | Status: DC | PRN

## 2017-07-11 NOTE — Discharge Summary (Signed)
Obstetric Discharge Summary Reason for Admission: cesarean section Prenatal Procedures: NST and ultrasound Intrapartum Procedures: cesarean: low cervical, transverse Postpartum Procedures: none Complications-Operative and Postpartum: none Hemoglobin  Date Value Ref Range Status  06/22/2017 9.4 (L) 12.0 - 15.0 g/dL Final   HCT  Date Value Ref Range Status  06/22/2017 27.2 (L) 36.0 - 46.0 % Final    Physical Exam:  General: alert, cooperative and appears stated age 38Lochia: appropriate Uterine Fundus: firm Incision: healing well DVT Evaluation: No evidence of DVT seen on physical exam.  Discharge Diagnoses: Term Pregnancy-delivered  Discharge Information: Date: 07/11/2017 Activity: pelvic rest Diet: routine Medications: Ibuprofen, Colace and Percocet Condition: improved Instructions: refer to practice specific booklet Discharge to: home   Newborn Data: Live born female  Birth Weight: 5 lb 14.5 oz (2680 g) APGAR: 7, 8  Newborn Delivery   Birth date/time:  06/21/2017 09:01:00 Delivery type:  C-Section, Low Transverse C-section categorization:  Repeat     Home with mother.  Pama Roskos H. 07/11/2017, 9:03 AM

## 2020-05-15 ENCOUNTER — Other Ambulatory Visit: Payer: Self-pay

## 2020-05-15 ENCOUNTER — Emergency Department (INDEPENDENT_AMBULATORY_CARE_PROVIDER_SITE_OTHER)
Admission: RE | Admit: 2020-05-15 | Discharge: 2020-05-15 | Disposition: A | Discharge status: Home / Self Care | Payer: Self-pay | Source: Ambulatory Visit

## 2020-05-15 VITALS — BP 115/79 | HR 1 | Temp 97.9°F | Resp 15

## 2020-05-15 DIAGNOSIS — S161XXA Strain of muscle, fascia and tendon at neck level, initial encounter: Secondary | ICD-10-CM

## 2020-05-15 DIAGNOSIS — S46919A Strain of unspecified muscle, fascia and tendon at shoulder and upper arm level, unspecified arm, initial encounter: Secondary | ICD-10-CM

## 2020-05-15 MED ORDER — PREDNISONE 10 MG PO TABS: 10.0000 mg | ORAL_TABLET | Freq: Every day | ORAL | 0 refills | Status: DC

## 2020-05-15 MED ORDER — CYCLOBENZAPRINE HCL 5 MG PO TABS: 5.0000 mg | ORAL_TABLET | Freq: Every day | ORAL | 0 refills | Status: DC
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
## 2020-05-15 NOTE — Discharge Instructions (Addendum)
If you are not improving by Wednesday, please return so that we can consider physical therapy and rehabilitation.
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
## 2020-05-18 ENCOUNTER — Emergency Department (INDEPENDENT_AMBULATORY_CARE_PROVIDER_SITE_OTHER): Payer: BC Managed Care – PPO

## 2020-05-18 ENCOUNTER — Emergency Department (INDEPENDENT_AMBULATORY_CARE_PROVIDER_SITE_OTHER)
Admission: RE | Admit: 2020-05-18 | Discharge: 2020-05-18 | Disposition: A | Discharge status: Home / Self Care | Payer: Self-pay | Source: Ambulatory Visit | Attending: Family Medicine | Admitting: Family Medicine

## 2020-05-18 ENCOUNTER — Other Ambulatory Visit: Payer: Self-pay

## 2020-05-18 VITALS — BP 134/78 | HR 81 | Temp 99.0°F | Resp 16

## 2020-05-18 DIAGNOSIS — S161XXA Strain of muscle, fascia and tendon at neck level, initial encounter: Secondary | ICD-10-CM | POA: Diagnosis not present

## 2020-05-18 DIAGNOSIS — S29012D Strain of muscle and tendon of back wall of thorax, subsequent encounter: Secondary | ICD-10-CM

## 2020-05-18 DIAGNOSIS — S161XXD Strain of muscle, fascia and tendon at neck level, subsequent encounter: Secondary | ICD-10-CM
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
## 2020-05-18 NOTE — Discharge Instructions (Addendum)
Apply ice pack for 20 to 30 minutes, 3 to 4 times daily  Continue until pain and swelling decrease.  Wear soft cervical collar.  May take Ibuprofen 200mg , 4 tabs every 8 hours with food.  May continue Flexeril at bedtime.  Begin range of motion and stretching exercises as tolerated.
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
## 2020-07-15 DIAGNOSIS — Z79899 Other long term (current) drug therapy: Secondary | ICD-10-CM | POA: Diagnosis not present

## 2020-07-15 DIAGNOSIS — F419 Anxiety disorder, unspecified: Secondary | ICD-10-CM | POA: Diagnosis not present

## 2020-07-15 DIAGNOSIS — R41840 Attention and concentration deficit: Secondary | ICD-10-CM | POA: Diagnosis not present

## 2021-01-06 DIAGNOSIS — F419 Anxiety disorder, unspecified: Secondary | ICD-10-CM | POA: Diagnosis not present

## 2021-01-06 DIAGNOSIS — Z79899 Other long term (current) drug therapy: Secondary | ICD-10-CM | POA: Diagnosis not present

## 2021-01-06 DIAGNOSIS — R41840 Attention and concentration deficit: Secondary | ICD-10-CM | POA: Diagnosis not present

## 2021-04-13 ENCOUNTER — Other Ambulatory Visit: Payer: Self-pay

## 2021-04-13 ENCOUNTER — Ambulatory Visit
Admission: RE | Admit: 2021-04-13 | Discharge: 2021-04-13 | Disposition: A | Discharge status: Home / Self Care | Payer: BC Managed Care – PPO | Source: Ambulatory Visit | Attending: Internal Medicine | Admitting: Internal Medicine

## 2021-04-13 VITALS — BP 117/82 | HR 104 | Temp 98.9°F | Resp 18

## 2021-04-13 DIAGNOSIS — A084 Viral intestinal infection, unspecified: Secondary | ICD-10-CM

## 2021-04-13 DIAGNOSIS — R0602 Shortness of breath: Secondary | ICD-10-CM | POA: Diagnosis not present

## 2021-04-13 DIAGNOSIS — R110 Nausea: Secondary | ICD-10-CM

## 2021-04-13 DIAGNOSIS — R197 Diarrhea, unspecified: Secondary | ICD-10-CM

## 2021-04-13 NOTE — ED Provider Notes (Signed)
[facility]    CSN: [id number] Arrival date & time: 04/13/21  1807      History   Chief Complaint No chief complaint on file.   HPI Krystal Taylor is a 35 y.o. female.   Patient presents for further evaluation for shortness of breath that started today. Patient states that "it feels like something is sitting on her chest" and "breathing feels heavy". Denies any cardiac or lung history. Denies any chest pain. Denies any upper respiratory symptoms. Patient did have diarrhea and nausea without vomiting for approximately 5-7 days prior to shortness of breath starting. Diarrhea has resolved but nausea is still present at times. Patient states that she has been able to drink plenty of fluids. Denies any known fevers. Denies noticing any blood in stool. Denies any sick contacts.     Past Medical History:  Diagnosis Date   Hx of varicella    Medical history non-contributory     Patient Active Problem List   Diagnosis Date Noted   History of cesarean delivery, antepartum 06/21/2017   Pregnancy with history of cesarean section, antepartum 06/21/2017    Past Surgical History:  Procedure Laterality Date   CESAREAN SECTION N/A 02/25/2015   Procedure: CESAREAN SECTION;  Surgeon: Waynard Reeds, MD;  Location: [facility];  Service: Obstetrics;  Laterality: N/A;   CESAREAN SECTION N/A 06/21/2017   Procedure: REPEAT CESAREAN SECTION;  Surgeon: Marlow Baars, MD;  Location: [facility]IRTHING SU[facility];  Service: Obstetrics;  Laterality: N/A;   WISDOM TOOTH EXTRACTION      OB History     Gravida  2   Para  2   Term  2   Preterm      AB      Living  2      SAB      IAB      Ectopic      Multiple  0   Live Births  1            Home Medications    Prior to Admission medications   Medication Sig Start Date End Date Taking? Authorizing Provider  sertraline (ZOLOFT) 50 MG tablet sertraline 50 mg tablet  TAKE 1 TABLET BY MOUTH EVERY DAY    [provider]   Ferrous Sulfate (SLOW RELEASE IRON PO) Take 1 tablet by mouth daily.  05/15/20  [provider]    Family History Family History  Problem Relation Age of Onset   Cleft palate Father    Hypertension Father    Hypertension Mother     Social History Social History   Tobacco Use   Smoking status: Never   Smokeless tobacco: Never  Substance Use Topics   Alcohol use: Yes    Comment: occ   Drug use: No     Allergies   Patient has no known allergies.   Review of Systems Review of Systems  Per HPI Physical Exam Triage Vital Signs ED Triage Vitals [04/13/21 1827]  Enc Vitals Group     BP 117/82     Pulse Rate (!) 104     Resp 18     Temp 98.9 F (37.2 C)     Temp Source Oral     SpO2 98 %     Weight      Height      Head Circumference      Peak Flow      Pain Score 0     Pain Loc  Pain Edu?      Excl. in GC?    No data found.  Updated Vital Signs BP 117/82 (BP Location: Left Arm)   Pulse (!) 104   Temp 98.9 F (37.2 C) (Oral)   Resp 18   LMP 04/08/2021   SpO2 98%   Visual Acuity Right Eye Distance:   Left Eye Distance:   Bilateral Distance:    Right Eye Near:   Left Eye Near:    Bilateral Near:     Physical Exam Constitutional:      Appearance: Normal appearance.  HENT:     Head: Normocephalic and atraumatic.     Right Ear: Tympanic membrane and ear canal normal.     Left Ear: Tympanic membrane and ear canal normal.     Nose: Nose normal.     Mouth/Throat:     Lips: Pink.     Mouth: Mucous membranes are moist.     Pharynx: No posterior oropharyngeal erythema.  Eyes:     Extraocular Movements: Extraocular movements intact.     Conjunctiva/sclera: Conjunctivae normal.  Cardiovascular:     Rate and Rhythm: Regular rhythm. Tachycardia present.     Pulses: Normal pulses.     Heart sounds: Normal heart sounds.  Pulmonary:     Effort: Pulmonary effort is normal. No respiratory distress.     Breath sounds: Normal breath sounds.  No wheezing, rhonchi or rales.  Abdominal:     General: Abdomen is flat. Bowel sounds are normal. There is no distension.     Palpations: Abdomen is soft.     Tenderness: There is no abdominal tenderness.  Skin:    General: Skin is warm and dry.  Neurological:     General: No focal deficit present.     Mental Status: She is alert and oriented to person, place, and time. Mental status is at baseline.  Psychiatric:        Mood and Affect: Mood normal.        Behavior: Behavior normal.        Thought Content: Thought content normal.        Judgment: Judgment normal.     UC Treatments / Results  Labs (all labs ordered are listed, but only abnormal results are displayed) Labs Reviewed - No data to display  EKG   Radiology No results found.  Procedures Procedures (including critical care time)  Medications Ordered in UC Medications - No data to display  Initial Impression / Assessment and Plan / UC Course  I have reviewed the triage vital signs and the nursing notes.  Pertinent labs & imaging results that were available during my care of the patient were reviewed by me and considered in my medical decision making (see chart for details).     EKG normal sinus rhythm. Advised patient that she needs to go to the hospital for further evaluation and management. Patient may be dehydrated with slightly elevated heart rate and history of diarrhea but shortness of breath is misleading. Will need to possibly rule out worrisome diagnoses including PE and cardiac related problems causing heaviness feeling in chest. Patient was agreeable with plan. Due to normal EKG and stable vital signs, agree with patient self transport to ED. Do not think EMS transport is necessary at this time.  Final Clinical Impressions(s) / UC Diagnoses   Final diagnoses:  Shortness of breath  Viral gastroenteritis  Nausea without vomiting  Diarrhea, unspecified type     Discharge Instructions  Please  go to the hospital as soon as you leave urgent care for further evaluation and management.      ED Prescriptions   None    PDMP not reviewed this encounter.   Lance Muss, FNP 04/13/21 1910

## 2021-04-13 NOTE — Discharge Instructions (Signed)
Please go to the hospital as soon as you leave urgent care for further evaluation and management. 

## 2021-04-13 NOTE — ED Triage Notes (Signed)
Pt c/o diarrhea x5 days. States last night after picking up her 40lb child she has felt winded, heaviness to breath. Denies SOB. Pt speaking in complete sentences.

## 2021-04-16 ENCOUNTER — Encounter (HOSPITAL_COMMUNITY): Payer: Self-pay | Admitting: Emergency Medicine

## 2021-04-16 ENCOUNTER — Emergency Department (HOSPITAL_COMMUNITY): Payer: BC Managed Care – PPO

## 2021-04-16 ENCOUNTER — Ambulatory Visit: Payer: BC Managed Care – PPO

## 2021-04-16 ENCOUNTER — Telehealth: Payer: Self-pay | Admitting: Emergency Medicine

## 2021-04-16 ENCOUNTER — Emergency Department (HOSPITAL_COMMUNITY)
Admission: EM | Admit: 2021-04-16 | Discharge: 2021-04-16 | Disposition: A | Discharge status: Home / Self Care | Payer: BC Managed Care – PPO | Attending: Emergency Medicine | Admitting: Emergency Medicine

## 2021-04-16 DIAGNOSIS — N9489 Other specified conditions associated with female genital organs and menstrual cycle: Secondary | ICD-10-CM | POA: Insufficient documentation

## 2021-04-16 DIAGNOSIS — R509 Fever, unspecified: Secondary | ICD-10-CM | POA: Insufficient documentation

## 2021-04-16 DIAGNOSIS — R42 Dizziness and giddiness: Secondary | ICD-10-CM | POA: Diagnosis not present

## 2021-04-16 DIAGNOSIS — R630 Anorexia: Secondary | ICD-10-CM | POA: Diagnosis not present

## 2021-04-16 DIAGNOSIS — R5383 Other fatigue: Secondary | ICD-10-CM | POA: Diagnosis not present

## 2021-04-16 DIAGNOSIS — R197 Diarrhea, unspecified: Secondary | ICD-10-CM | POA: Insufficient documentation

## 2021-04-16 DIAGNOSIS — R109 Unspecified abdominal pain: Secondary | ICD-10-CM | POA: Diagnosis not present

## 2021-04-16 DIAGNOSIS — R531 Weakness: Secondary | ICD-10-CM | POA: Insufficient documentation

## 2021-04-16 DIAGNOSIS — R9431 Abnormal electrocardiogram [ECG] [EKG]: Secondary | ICD-10-CM | POA: Diagnosis not present

## 2021-04-16 DIAGNOSIS — R1084 Generalized abdominal pain: Secondary | ICD-10-CM | POA: Insufficient documentation

## 2021-04-16 LAB — URINALYSIS, ROUTINE W REFLEX MICROSCOPIC
Bilirubin Urine: NEGATIVE
Glucose, UA: NEGATIVE mg/dL
Hgb urine dipstick: NEGATIVE
Ketones, ur: 5 mg/dL — AB
Leukocytes,Ua: NEGATIVE
Nitrite: NEGATIVE
Protein, ur: NEGATIVE mg/dL
Specific Gravity, Urine: 1.012 (ref 1.005–1.030)
pH: 6 (ref 5.0–8.0)

## 2021-04-16 LAB — LIPASE, BLOOD: Lipase: 27 U/L (ref 11–51)

## 2021-04-16 LAB — I-STAT BETA HCG BLOOD, ED (MC, WL, AP ONLY): I-stat hCG, quantitative: <5 m[IU]/mL (ref <5)

## 2021-04-16 LAB — COMPREHENSIVE METABOLIC PANEL
ALT: 116 U/L — ABNORMAL HIGH (ref 0–44)
AST: 76 U/L — ABNORMAL HIGH (ref 15–41)
Albumin: 3.9 g/dL (ref 3.5–5.0)
Alkaline Phosphatase: 58 U/L (ref 38–126)
Anion gap: 9 (ref 5–15)
BUN: 5 mg/dL — ABNORMAL LOW (ref 6–20)
CO2: 26 mmol/L (ref 22–32)
Calcium: 9.2 mg/dL (ref 8.9–10.3)
Chloride: 102 mmol/L (ref 98–111)
Creatinine, Ser: 0.77 mg/dL (ref 0.44–1.00)
GFR, Estimated: >60 mL/min (ref >60)
Glucose, Bld: 97 mg/dL (ref 70–99)
Potassium: 3 mmol/L — ABNORMAL LOW (ref 3.5–5.1)
Sodium: 137 mmol/L (ref 135–145)
Total Bilirubin: 0.6 mg/dL (ref 0.3–1.2)
Total Protein: 7.5 g/dL (ref 6.5–8.1)

## 2021-04-16 LAB — CBC
HCT: 40.3 % (ref 36.0–46.0)
Hemoglobin: 13.9 g/dL (ref 12.0–15.0)
MCH: 27.3 pg (ref 26.0–34.0)
MCHC: 34.5 g/dL (ref 30.0–36.0)
MCV: 79 fL — ABNORMAL LOW (ref 80.0–100.0)
Platelets: 323 10*3/uL (ref 150–400)
RBC: 5.1 MIL/uL (ref 3.87–5.11)
RDW: 13.2 % (ref 11.5–15.5)
WBC: 3.7 10*3/uL — ABNORMAL LOW (ref 4.0–10.5)
nRBC: 0 % (ref 0.0–0.2)

## 2021-04-16 LAB — MAGNESIUM: Magnesium: 1.8 mg/dL (ref 1.7–2.4)

## 2021-04-16 LAB — TSH: TSH: 1.646 u[IU]/mL (ref 0.350–4.500)

## 2021-04-16 MED ORDER — SODIUM CHLORIDE 0.9 % IV BOLUS
2000.0000 mL | Freq: Once | INTRAVENOUS | Status: AC
  Administered 2021-04-16: 2000 mL via INTRAVENOUS

## 2021-04-16 MED ORDER — ONDANSETRON 4 MG PO TBDP: 4.0000 mg | ORAL_TABLET | Freq: Three times a day (TID) | ORAL | 0 refills | Status: DC | PRN

## 2021-04-16 MED ORDER — IOHEXOL 300 MG/ML  SOLN
75.0000 mL | Freq: Once | INTRAMUSCULAR | Status: AC | PRN
Start: 2021-04-16 — End: 2021-04-16
  Administered 2021-04-16: 75 mL via INTRAVENOUS

## 2021-04-16 MED ORDER — DICYCLOMINE HCL 20 MG PO TABS: 20.0000 mg | ORAL_TABLET | Freq: Three times a day (TID) | ORAL | 0 refills | Status: DC | PRN

## 2021-04-16 MED ORDER — POTASSIUM CHLORIDE CRYS ER 20 MEQ PO TBCR
40.0000 meq | EXTENDED_RELEASE_TABLET | Freq: Once | ORAL | Status: AC
  Administered 2021-04-16: 40 meq via ORAL
  Filled 2021-04-16: qty 2

## 2021-04-16 MED ORDER — POTASSIUM CHLORIDE CRYS ER 20 MEQ PO TBCR: 20.0000 meq | EXTENDED_RELEASE_TABLET | Freq: Every day | ORAL | 0 refills | Status: DC

## 2021-04-16 NOTE — ED Provider Notes (Signed)
[facility]   CSN: [id number] Arrival date & time: 04/16/21  1125     History Chief Complaint  Patient presents with   Diarrhea    Krystal RodriguezKelly Taylor is a 35 y.o. female without significant past medical history who presents to the emergency department with complaints of diarrhea x 8 days.  Patient reports her symptoms started 08/14-she developed generalized abdominal pain and had 10 episodes of diarrhea that day with subsequent development of low-grade fevers.  The fevers resolved within a few days, her abdominal pain is mostly resolved however this does recur occasionally.  Her diarrhea unfortunately however has continued, she reports 5 episodes of diarrhea per day, watery and yellow in color with poor appetite and decreased p.o. intake.  She has lost 10 pounds since her symptoms started, feels generally weak,  fatigued, and at times feels lightheaded.  She did have some shortness of breath and chest discomfort a few days ago prompting an urgent care visit, but this has since resolved.  She denies vomiting, hematemesis, melena, hematochezia, dysuria, or syncope.  She denies recent foreign travel or antibiotic use.  Her son did have a GI bug a week prior to her onset of symptoms, however this resolved very quickly.  HPI     Past Medical History:  Diagnosis Date   Hx of varicella    Medical history non-contributory     Patient Active Problem List   Diagnosis Date Noted   History of cesarean delivery, antepartum 06/21/2017   Pregnancy with history of cesarean section, antepartum 06/21/2017    Past Surgical History:  Procedure Laterality Date   CESAREAN SECTION N/A 02/25/2015   Procedure: CESAREAN SECTION;  Surgeon: Waynard ReedsKendra Ross, MD;  Location: [facility];  Service: Obstetrics;  Laterality: N/A;   CESAREAN SECTION N/A 06/21/2017   Procedure: REPEAT CESAREAN SECTION;  Surgeon: Marlow Baarslark, Dyanna, MD;  [facility];  Service:  Obstetrics;  Laterality: N/A;   WISDOM TOOTH EXTRACTION       OB History     Gravida  2   Para  2   Term  2   Preterm      AB      Living  2      SAB      IAB      Ectopic      Multiple  0   Live Births  1           Family History  Problem Relation Age of Onset   Cleft palate Father    Hypertension Father    Hypertension Mother     Social History   Tobacco Use   Smoking status: Never   Smokeless tobacco: Never  Substance Use Topics   Alcohol use: Yes    Comment: occ   Drug use: No    Home Medications Prior to Admission medications   Medication Sig Start Date End Date Taking? Authorizing Provider  sertraline (ZOLOFT) 50 MG tablet sertraline 50 mg tablet  TAKE 1 TABLET BY MOUTH EVERY DAY    [provider]  Ferrous Sulfate (SLOW RELEASE IRON PO) Take 1 tablet by mouth daily.  05/15/20  [provider]    Allergies    Patient has no known allergies.  Review of Systems   Review of Systems  Constitutional:  Positive for appetite change, fatigue and fever (resolved).  Respiratory:  Positive for shortness of breath (resolved).   Cardiovascular:  Positive for chest pain (resolved).  Gastrointestinal:  Positive for abdominal pain, diarrhea and nausea. Negative for anal bleeding, blood in stool, constipation and vomiting.  Genitourinary:  Positive for vaginal bleeding (just finished her menstrual cycle- resolved). Negative for dysuria.  Neurological:  Positive for light-headedness. Negative for syncope.  All other systems reviewed and are negative.  Physical Exam Updated Vital Signs BP 122/81 (BP Location: Left Arm)   Pulse 93   Temp 98.2 F (36.8 C) (Oral)   Resp 14   LMP 04/08/2021   SpO2 96%   Physical Exam Vitals and nursing note reviewed.  Constitutional:      General: She is not in acute distress.    Appearance: She is well-developed. She is not toxic-appearing.  HENT:     Head: Normocephalic and atraumatic.   Eyes:     General:        Right eye: No discharge.        Left eye: No discharge.     Conjunctiva/sclera: Conjunctivae normal.  Cardiovascular:     Rate and Rhythm: Normal rate and regular rhythm.  Pulmonary:     Effort: Pulmonary effort is normal. No respiratory distress.     Breath sounds: Normal breath sounds. No wheezing, rhonchi or rales.  Abdominal:     General: There is no distension.     Palpations: Abdomen is soft.     Tenderness: There is abdominal tenderness (periumbilical). There is no guarding or rebound.  Musculoskeletal:     Cervical back: Neck supple.  Skin:    General: Skin is warm and dry.     Findings: No rash.  Neurological:     Mental Status: She is alert.     Comments: Clear speech.   Psychiatric:        Behavior: Behavior normal.    ED Results / Procedures / Treatments   Labs (all labs ordered are listed, but only abnormal results are displayed) Labs Reviewed  COMPREHENSIVE METABOLIC PANEL - Abnormal; Notable for the following components:      Result Value   Potassium 3.0 (*)    BUN 5 (*)    AST 76 (*)    ALT 116 (*)    All other components within normal limits  CBC - Abnormal; Notable for the following components:   WBC 3.7 (*)    MCV 79.0 (*)    All other components within normal limits  URINALYSIS, ROUTINE W REFLEX MICROSCOPIC - Abnormal; Notable for the following components:   Ketones, ur 5 (*)    All other components within normal limits  GASTROINTESTINAL PANEL BY PCR, STOOL (REPLACES STOOL CULTURE)  C DIFFICILE QUICK SCREEN W PCR REFLEX    LIPASE, BLOOD  MAGNESIUM  TSH  I-STAT BETA HCG BLOOD, ED ([facility], [facility], [facility] ONLY)    EKG EKG Interpretation  Date/Time:  Sunday April 16 2021 12:54:48 EDT Ventricular Rate:  88 PR Interval:  161 QRS Duration: 93 QT Interval:  379 QTC Calculation: 459 R Axis:   12 Text Interpretation: Sinus rhythm Low voltage, precordial leads Borderline T abnormalities, diffuse leads No significant change since  last tracing Confirmed by Jacalyn Lefevre [phone]) on 04/16/2021 1:08:03 PM  Radiology CT Abdomen Pelvis W Contrast  Result Date: 04/16/2021 CLINICAL DATA:  Diarrhea, abdominal pain EXAM: CT ABDOMEN AND PELVIS WITH CONTRAST TECHNIQUE: Multidetector CT imaging of the abdomen and pelvis was performed using the standard protocol following bolus administration of intravenous contrast. CONTRAST:  93mL OMNIPAQUE IOHEXOL 300 MG/ML  SOLN COMPARISON:  None. FINDINGS: Lower chest:  No pleural or pericardial effusion. Visualized lung bases clear. Hepatobiliary: No focal liver abnormality is seen. No gallstones, gallbladder wall thickening, or biliary dilatation. Pancreas: Unremarkable. No pancreatic ductal dilatation or surrounding inflammatory changes. Spleen: Normal in size without focal abnormality. Adrenals/Urinary Tract: Adrenal glands unremarkable. , Unremarkable. Urinary bladder unremarkable Stomach/Bowel: Stomach is physiologically distended by ingested material and gas. The small bowel is decompressed. Normal appendix. The colon is nondilated, unremarkable. Vascular/Lymphatic: No significant vascular findings are present. No enlarged abdominal or pelvic lymph nodes. Reproductive: Uterus and bilateral adnexa are unremarkable. Other: No ascites.  No free air. Musculoskeletal: No acute or significant osseous findings. IMPRESSION: No acute findings. Electronically Signed   By: Corlis Leak M.D.   On: 04/16/2021 15:19    Procedures Procedures   Medications Ordered in ED Medications - No data to display  ED Course  I have reviewed the triage vital signs and the nursing notes.  Pertinent labs & imaging results that were available during my care of the patient were reviewed by me and considered in my medical decision making (see chart for details).    MDM Rules/Calculators/A&P                           Patient presents to the ED with complaints of diarrhea.  Patient is nontoxic, resting comfortably, vitals  are without significant abnormality.  Patient has mild periumbilical tenderness to palpation.  No peritoneal signs.  Additional history obtained:  Additional history obtained from chart review & nursing note review.   Lab Tests:  I Ordered, reviewed, and interpreted labs, which included:  CBC: Mild leukopenia CMP: Mild hypokalemia and elevated LFTs Lipase: Within normal limits UA: Minimal ketonuria, no UTI Preg test: Negative TSH: Within normal limits. Magnesium: Within normal limits  Imaging Studies ordered:  I ordered imaging studies which included CT abdomen/pelvis with contrast, I independently reviewed, formal radiology impression shows:  No acute findings.  ED Course:  Repeat abdominal exam remains without peritoneal signs, low suspicion for cholecystitis, pancreatitis, diverticulitis, appendicitis, bowel obstruction/perforation,  ectopic pregnancy, or other acute surgical process.  No significant electrolyte derangement, renal dysfunction, or thyroid dysfunction.  Mild transaminitis and leukopenia will need PCP recheck.  Potassium will be orally replaced with diet guidelines provided.  Unclear definitive etiology to patient's symptoms, remains that this may possibly be a viral GI illness, we will treat supportively and have the patient follow-up closely with GI, she overall appears appropriate for discharge home.   I discussed results, treatment plan, need for follow-up, and return precautions with the patient and parent at bedside. Provided opportunity for questions, patient and parent confirmed understanding and are in agreement with plan.   Findings and plan of care discussed with supervising physician Dr. Particia Nearing who is in agreement.    Portions of this note were generated with Scientist, clinical (histocompatibility and immunogenetics). Dictation errors may occur despite best attempts at proofreading.  Final Clinical Impression(s) / ED Diagnoses Final diagnoses:  Diarrhea, unspecified type    Rx / DC  Orders ED Discharge Orders          Ordered    ondansetron (ZOFRAN ODT) 4 MG disintegrating tablet  Every 8 hours PRN        04/16/21 1604    dicyclomine (BENTYL) 20 MG tablet  3 times daily PRN        04/16/21 1604    potassium chloride SA (KLOR-CON) 20 MEQ tablet  Daily  04/16/21 1604             Lavaun Greenfield, Pleas Koch, PA-C 04/16/21 1608    Jacalyn Lefevre, MD 04/19/21 [phone]

## 2021-04-16 NOTE — Telephone Encounter (Signed)
Call back from [facility] regarding visit later on today - pt states SOB has resolved but has had diarrhea for 1 week and has lost 10 lbs. Pt confirmed she did not follow up in ED on the 18th since SOB resolved on its own. Chart reviewed w/ Dr Delton See. Pt directed to ED for stat labs & imaging. Pt verbalized an understanding

## 2021-04-16 NOTE — Telephone Encounter (Signed)
Chart review by provider  (Dr Delton See) Krystal Taylor was seen on 04/13/21 at  [facility] & directed to go to the ED. Call to pt to direct her to the ED as instructed on 04/13/21 for higher level of care & testing. Information left in voice mail

## 2021-04-16 NOTE — Discharge Instructions (Addendum)
You were seen in the emergency department today for diarrhea.  Your work-up in the emergency department was overall reassuring.  Your CT scan did not show any significant acute abnormalities.  Your detailed labs and imaging results are below.  Your labs did show that your white blood cell count was mildly low, your liver function tests were mildly elevated, and your potassium was low.  Please have each of these lab tests rechecked by your primary care provider if you do not have a primary care provider please see circled phone number in your discharge instructions to be able to establish care locally.  Please follow attached diet guidelines to help alleviate diarrhea. We are sending you home with the following prescriptions: - Zofran: Take every 8 hours as needed for nausea and vomiting - Bentyl: Take every 8 hours as needed for abdominal cramps/spasms, you may also try this to see if it helps with your diarrhea. -Potassium chloride: This is a potassium supplement to take for the next 5 days to help maintain your potassium levels.  Please also see attached diet guidelines for foods rich in potassium.  We have prescribed you new medication(s) today. Discuss the medications prescribed today with your pharmacist as they can have adverse effects and interactions with your other medicines including over the counter and prescribed medications. Seek medical evaluation if you start to experience new or abnormal symptoms after taking one of these medicines, seek care immediately if you start to experience difficulty breathing, feeling of your throat closing, facial swelling, or rash as these could be indications of a more serious allergic reaction  We are unsure of the exact cause of your symptoms, this could be due to a viral GI illness, we would like you to call the GI office to follow up for re-evaluation and possible further assessment. Please call the GI office first thing tomorrow morning to schedule the  soonest available appointment.  Return to the emergency department for any new or worsening symptoms including but not limited to new or worsening pain, fever, inability to keep fluids down, blood in your vomit or stool, passing out, or any other concerns.    Results for orders placed or performed during the hospital encounter of 04/16/21  Lipase, blood  Result Value Ref Range   Lipase 27 11 - 51 U/L  Comprehensive metabolic panel  Result Value Ref Range   Sodium 137 135 - 145 mmol/L   Potassium 3.0 (L) 3.5 - 5.1 mmol/L   Chloride 102 98 - 111 mmol/L   CO2 26 22 - 32 mmol/L   Glucose, Bld 97 70 - 99 mg/dL   BUN 5 (L) 6 - 20 mg/dL   Creatinine, Ser 6.01 0.44 - 1.00 mg/dL   Calcium 9.2 8.9 - 09.3 mg/dL   Total Protein 7.5 6.5 - 8.1 g/dL   Albumin 3.9 3.5 - 5.0 g/dL   AST 76 (H) 15 - 41 U/L   ALT 116 (H) 0 - 44 U/L   Alkaline Phosphatase 58 38 - 126 U/L   Total Bilirubin 0.6 0.3 - 1.2 mg/dL   GFR, Estimated >23 >55 mL/min   Anion gap 9 5 - 15  CBC  Result Value Ref Range   WBC 3.7 (L) 4.0 - 10.5 K/uL   RBC 5.10 3.87 - 5.11 MIL/uL   Hemoglobin 13.9 12.0 - 15.0 g/dL   HCT 73.2 20.2 - 54.2 %   MCV 79.0 (L) 80.0 - 100.0 fL   MCH 27.3 26.0 - 34.0  pg   MCHC 34.5 30.0 - 36.0 g/dL   RDW 41.9 37.9 - 02.4 %   Platelets 323 150 - 400 K/uL   nRBC 0.0 0.0 - 0.2 %  Urinalysis, Routine w reflex microscopic Urine, Clean Catch  Result Value Ref Range   Color, Urine YELLOW YELLOW   APPearance CLEAR CLEAR   Specific Gravity, Urine 1.012 1.005 - 1.030   pH 6.0 5.0 - 8.0   Glucose, UA NEGATIVE NEGATIVE mg/dL   Hgb urine dipstick NEGATIVE NEGATIVE   Bilirubin Urine NEGATIVE NEGATIVE   Ketones, ur 5 (A) NEGATIVE mg/dL   Protein, ur NEGATIVE NEGATIVE mg/dL   Nitrite NEGATIVE NEGATIVE   Leukocytes,Ua NEGATIVE NEGATIVE  Magnesium  Result Value Ref Range   Magnesium 1.8 1.7 - 2.4 mg/dL  TSH  Result Value Ref Range   TSH 1.646 0.350 - 4.500 uIU/mL  I-Stat beta hCG blood, ED  Result Value  Ref Range   I-stat hCG, quantitative <5.0 <5 mIU/mL   Comment 3           CT Abdomen Pelvis W Contrast  Result Date: 04/16/2021 CLINICAL DATA:  Diarrhea, abdominal pain EXAM: CT ABDOMEN AND PELVIS WITH CONTRAST TECHNIQUE: Multidetector CT imaging of the abdomen and pelvis was performed using the standard protocol following bolus administration of intravenous contrast. CONTRAST:  18mL OMNIPAQUE IOHEXOL 300 MG/ML  SOLN COMPARISON:  None. FINDINGS: Lower chest: No pleural or pericardial effusion. Visualized lung bases clear. Hepatobiliary: No focal liver abnormality is seen. No gallstones, gallbladder wall thickening, or biliary dilatation. Pancreas: Unremarkable. No pancreatic ductal dilatation or surrounding inflammatory changes. Spleen: Normal in size without focal abnormality. Adrenals/Urinary Tract: Adrenal glands unremarkable. , Unremarkable. Urinary bladder unremarkable Stomach/Bowel: Stomach is physiologically distended by ingested material and gas. The small bowel is decompressed. Normal appendix. The colon is nondilated, unremarkable. Vascular/Lymphatic: No significant vascular findings are present. No enlarged abdominal or pelvic lymph nodes. Reproductive: Uterus and bilateral adnexa are unremarkable. Other: No ascites.  No free air. Musculoskeletal: No acute or significant osseous findings. IMPRESSION: No acute findings. Electronically Signed   By: Corlis Leak M.D.   On: 04/16/2021 15:19

## 2021-04-16 NOTE — ED Notes (Signed)
Pt transported to CT via stretcher at this time.  

## 2021-04-16 NOTE — ED Triage Notes (Signed)
Pt reports diarrhea x 7 days.  Denies abd pain.  Denies fever or any other associated symptoms.  10 lb weight loss.

## 2021-04-16 NOTE — ED Notes (Signed)
EDP at bedside  

## 2021-06-08 DIAGNOSIS — Z79899 Other long term (current) drug therapy: Secondary | ICD-10-CM | POA: Diagnosis not present

## 2021-06-08 DIAGNOSIS — F419 Anxiety disorder, unspecified: Secondary | ICD-10-CM | POA: Diagnosis not present
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
## 2021-11-30 DIAGNOSIS — F419 Anxiety disorder, unspecified: Secondary | ICD-10-CM | POA: Diagnosis not present

## 2021-11-30 DIAGNOSIS — Z79899 Other long term (current) drug therapy: Secondary | ICD-10-CM | POA: Diagnosis not present

## 2021-12-05 DIAGNOSIS — F902 Attention-deficit hyperactivity disorder, combined type: Secondary | ICD-10-CM | POA: Diagnosis not present

## 2021-12-05 DIAGNOSIS — Z79899 Other long term (current) drug therapy: Secondary | ICD-10-CM | POA: Diagnosis not present

## 2022-03-01 DIAGNOSIS — Z01419 Encounter for gynecological examination (general) (routine) without abnormal findings: Secondary | ICD-10-CM | POA: Diagnosis not present

## 2022-03-01 DIAGNOSIS — Z6834 Body mass index (BMI) 34.0-34.9, adult: Secondary | ICD-10-CM | POA: Diagnosis not present

## 2022-04-16 ENCOUNTER — Ambulatory Visit: Payer: BC Managed Care – PPO | Admitting: Nurse Practitioner

## 2022-04-26 ENCOUNTER — Encounter: Payer: Self-pay | Admitting: Nurse Practitioner

## 2022-04-26 ENCOUNTER — Ambulatory Visit (INDEPENDENT_AMBULATORY_CARE_PROVIDER_SITE_OTHER): Payer: BC Managed Care – PPO | Admitting: Nurse Practitioner

## 2022-04-26 VITALS — BP 94/61 | HR 77 | Ht 64.0 in | Wt 200.4 lb

## 2022-04-26 DIAGNOSIS — R635 Abnormal weight gain: Secondary | ICD-10-CM

## 2022-04-26 DIAGNOSIS — R5383 Other fatigue: Secondary | ICD-10-CM

## 2022-04-26 DIAGNOSIS — E559 Vitamin D deficiency, unspecified: Secondary | ICD-10-CM

## 2022-04-26 DIAGNOSIS — Z0000 Encounter for general adult medical examination without abnormal findings: Secondary | ICD-10-CM

## 2022-04-26 DIAGNOSIS — Z7689 Persons encountering health services in other specified circumstances: Secondary | ICD-10-CM

## 2022-04-26 NOTE — Progress Notes (Signed)
New Patient Office Visit  Subjective    Patient ID: Krystal Taylor, female    DOB: 07/26/1986  Age: 36 y.o. MRN: [id number]  CC:  Chief Complaint  Patient presents with   New Patient (Initial Visit)    HPI Krystal Taylor presents to establish care Has not had a primary care provider since she has been an adult.  -does see GYN provider. Is up to date with well woman care.  -does see [facility]. They prescribe her medication for for anxiety/depression -she is doe to have routine, fasting labs and routine physical.  -she has no concerns or complaints.   Outpatient Encounter Medications as of 04/26/2022  Medication Sig   sertraline (ZOLOFT) 50 MG tablet sertraline 50 mg tablet  TAKE 1 TABLET BY MOUTH EVERY DAY   [DISCONTINUED] dicyclomine (BENTYL) 20 MG tablet Take 1 tablet (20 mg total) by mouth 3 (three) times daily as needed for spasms (abdominal cramps).   [DISCONTINUED] Ferrous Sulfate (SLOW RELEASE IRON PO) Take 1 tablet by mouth daily.   [DISCONTINUED] ondansetron (ZOFRAN ODT) 4 MG disintegrating tablet Take 1 tablet (4 mg total) by mouth every 8 (eight) hours as needed for nausea or vomiting.   [DISCONTINUED] potassium chloride SA (KLOR-CON) 20 MEQ tablet Take 1 tablet (20 mEq total) by mouth daily.   No facility-administered encounter medications on file as of 04/26/2022.    Past Medical History:  Diagnosis Date   Hx of varicella    Medical history non-contributory     Past Surgical History:  Procedure Laterality Date   CESAREAN SECTION N/A 02/25/2015   Procedure: CESAREAN SECTION;  Surgeon: Waynard Reeds, MD;  Location: [facility];  Service: Obstetrics;  Laterality: N/A;   CESAREAN SECTION N/A 06/21/2017   Procedure: REPEAT CESAREAN SECTION;  Surgeon: Marlow Baars, MD;  Location: [facility];  Service: Obstetrics;  Laterality: N/A;   WISDOM TOOTH EXTRACTION      Family History  Problem Relation Age of Onset   Cleft palate Father     Hypertension Father    Hypertension Mother     Social History   Socioeconomic History   Marital status: Married    Spouse name: Not on file   Number of children: Not on file   Years of education: Not on file   Highest education level: Not on file  Occupational History   Not on file  Tobacco Use   Smoking status: Never   Smokeless tobacco: Never  Substance and Sexual Activity   Alcohol use: Yes    Comment: occ   Drug use: No   Sexual activity: Yes  Other Topics Concern   Not on file  Social History Narrative   Not on file   Social Determinants of Health   Financial Resource Strain: Not on file  Food Insecurity: Not on file  Transportation Needs: Not on file  Physical Activity: Not on file  Stress: Not on file  Social Connections: Not on file  Intimate Partner Violence: Not on file    Review of Systems  Constitutional:  Negative for chills, fever and malaise/fatigue.  HENT:  Negative for congestion, sinus pain and sore throat.   Eyes: Negative.   Respiratory:  Negative for cough, shortness of breath and wheezing.   Cardiovascular:  Negative for chest pain, palpitations and leg swelling.  Gastrointestinal:  Negative for constipation, diarrhea, nausea and vomiting.  Genitourinary: Negative.   Musculoskeletal:  Negative for myalgias.  Skin: Negative.   Neurological:  Negative for  dizziness and headaches.  Endo/Heme/Allergies:  Does not bruise/bleed easily.  Psychiatric/Behavioral:  Negative for depression. The patient is not nervous/anxious.         Objective    Today's Vitals   04/26/22 0903  BP: 94/61  Pulse: 77  SpO2: 95%  Weight: 200 lb 6.4 oz (90.9 kg)  Height: 5\' 4"  (1.626 m)   Body mass index is 34.4 kg/m.   Physical Exam Vitals and nursing note reviewed.  Constitutional:      Appearance: Normal appearance. She is well-developed.  HENT:     Head: Normocephalic and atraumatic.     Nose: Nose normal.     Mouth/Throat:     Mouth: Mucous  membranes are moist.     Pharynx: Oropharynx is clear.  Eyes:     Extraocular Movements: Extraocular movements intact.     Conjunctiva/sclera: Conjunctivae normal.     Pupils: Pupils are equal, round, and reactive to light.  Cardiovascular:     Rate and Rhythm: Normal rate and regular rhythm.     Pulses: Normal pulses.     Heart sounds: Normal heart sounds.  Pulmonary:     Effort: Pulmonary effort is normal.     Breath sounds: Normal breath sounds.  Abdominal:     Palpations: Abdomen is soft.  Musculoskeletal:        General: Normal range of motion.     Cervical back: Normal range of motion and neck supple.  Lymphadenopathy:     Cervical: No cervical adenopathy.  Skin:    General: Skin is warm and dry.     Capillary Refill: Capillary refill takes less than 2 seconds.  Neurological:     General: No focal deficit present.     Mental Status: She is alert and oriented to person, place, and time.  Psychiatric:        Mood and Affect: Mood normal.        Behavior: Behavior normal.        Thought Content: Thought content normal.        Judgment: Judgment normal.      Assessment & Plan:  1. Other fatigue Check labs for further evaluation.  - TSH + free T4 - Comprehensive metabolic panel - CBC  2. Abnormal weight gain Check thyroid panel and HgbA1c  - TSH + free T4 - Hemoglobin A1c  3. Vitamin D deficiency Check vitamin d level and treat deficiency as indicated. - VITAMIN D 25 Hydroxy (Vit-D Deficiency, Fractures)  4. Healthcare maintenance Routine, fasting labs drawn during today's visit.  - Hemoglobin A1c - Lipid panel - Comprehensive metabolic panel - CBC  5. Encounter to establish care Appointment today to establish new primary care provider     Problem List Items Addressed This Visit       Other   Other fatigue - Primary   Relevant Orders   TSH + free T4 (Completed)   Comprehensive metabolic panel (Completed)   CBC (Completed)   Abnormal weight gain    Relevant Orders   TSH + free T4 (Completed)   Hemoglobin A1c (Completed)   Vitamin D deficiency   Relevant Orders   VITAMIN D 25 Hydroxy (Vit-D Deficiency, Fractures) (Completed)   Other Visit Diagnoses     Healthcare maintenance       Relevant Orders   Hemoglobin A1c (Completed)   Lipid panel (Completed)   Comprehensive metabolic panel (Completed)   CBC (Completed)   Encounter to establish care  Return in about 3 weeks (around 05/17/2022) for health maintenance exam.   Carlean Jews, NP

## 2022-04-27 LAB — HEMOGLOBIN A1C
Est. average glucose Bld gHb Est-mCnc: 114 mg/dL
Hgb A1c MFr Bld: 5.6 % (ref 4.8–5.6)

## 2022-04-27 LAB — COMPREHENSIVE METABOLIC PANEL
ALT: 18 IU/L (ref 0–32)
AST: 15 IU/L (ref 0–40)
Albumin/Globulin Ratio: 1.5 (ref 1.2–2.2)
Albumin: 4.4 g/dL (ref 3.9–4.9)
Alkaline Phosphatase: 80 IU/L (ref 44–121)
BUN/Creatinine Ratio: 12 (ref 9–23)
BUN: 8 mg/dL (ref 6–20)
Bilirubin Total: 0.3 mg/dL (ref 0.0–1.2)
CO2: 23 mmol/L (ref 20–29)
Calcium: 9.6 mg/dL (ref 8.7–10.2)
Chloride: 103 mmol/L (ref 96–106)
Creatinine, Ser: 0.67 mg/dL (ref 0.57–1.00)
Globulin, Total: 3 g/dL (ref 1.5–4.5)
Glucose: 91 mg/dL (ref 70–99)
Potassium: 4.6 mmol/L (ref 3.5–5.2)
Sodium: 139 mmol/L (ref 134–144)
Total Protein: 7.4 g/dL (ref 6.0–8.5)
eGFR: 118 mL/min/{1.73_m2} (ref >59)

## 2022-04-27 LAB — LIPID PANEL
Chol/HDL Ratio: 3.1 ratio (ref 0.0–4.4)
Cholesterol, Total: 165 mg/dL (ref 100–199)
HDL: 54 mg/dL (ref >39)
LDL Chol Calc (NIH): 98 mg/dL (ref 0–99)
Triglycerides: 65 mg/dL (ref 0–149)
VLDL Cholesterol Cal: 13 mg/dL (ref 5–40)

## 2022-04-27 LAB — CBC
Hematocrit: 37.5 % (ref 34.0–46.6)
Hemoglobin: 12.5 g/dL (ref 11.1–15.9)
MCH: 27.5 pg (ref 26.6–33.0)
MCHC: 33.3 g/dL (ref 31.5–35.7)
MCV: 82 fL (ref 79–97)
Platelets: 396 10*3/uL (ref 150–450)
RBC: 4.55 x10E6/uL (ref 3.77–5.28)
RDW: 12.7 % (ref 11.7–15.4)
WBC: 5.4 10*3/uL (ref 3.4–10.8)

## 2022-04-27 LAB — VITAMIN D 25 HYDROXY (VIT D DEFICIENCY, FRACTURES): Vit D, 25-Hydroxy: 13.9 ng/mL — ABNORMAL LOW (ref 30.0–100.0)

## 2022-04-27 LAB — TSH+FREE T4
Free T4: 0.97 ng/dL (ref 0.82–1.77)
TSH: 1.27 u[IU]/mL (ref 0.450–4.500)

## 2022-05-02 NOTE — Progress Notes (Signed)
Vitamin d deficiency. Treat with drisdol. Other labs good. Discuss with patient during visit 04/2022.

## 2022-05-13 DIAGNOSIS — R635 Abnormal weight gain: Secondary | ICD-10-CM | POA: Insufficient documentation

## 2022-05-13 DIAGNOSIS — E559 Vitamin D deficiency, unspecified: Secondary | ICD-10-CM | POA: Insufficient documentation

## 2022-05-13 DIAGNOSIS — R5383 Other fatigue: Secondary | ICD-10-CM | POA: Insufficient documentation

## 2022-05-23 ENCOUNTER — Ambulatory Visit (INDEPENDENT_AMBULATORY_CARE_PROVIDER_SITE_OTHER): Payer: BC Managed Care – PPO | Admitting: Nurse Practitioner

## 2022-05-23 ENCOUNTER — Encounter: Payer: Self-pay | Admitting: Nurse Practitioner

## 2022-05-23 VITALS — BP 104/63 | HR 63 | Ht 64.0 in | Wt 204.8 lb

## 2022-05-23 DIAGNOSIS — Z0001 Encounter for general adult medical examination with abnormal findings: Secondary | ICD-10-CM | POA: Diagnosis not present

## 2022-05-23 DIAGNOSIS — Z6835 Body mass index (BMI) 35.0-35.9, adult: Secondary | ICD-10-CM

## 2022-05-23 DIAGNOSIS — E559 Vitamin D deficiency, unspecified: Secondary | ICD-10-CM | POA: Diagnosis not present

## 2022-05-23 MED ORDER — ERGOCALCIFEROL 1.25 MG (50000 UT) PO CAPS: 50000.0000 [IU] | ORAL_CAPSULE | ORAL | 5 refills | Status: DC

## 2022-05-23 NOTE — Progress Notes (Signed)
Complete physical exam   Patient: Krystal Taylor   DOB: 28-Feb-1986   36 y.o. Female  MRN: [id number] Visit Date: 05/23/2022    Chief Complaint  Patient presents with   Annual Exam   Subjective    Krystal Taylor is a 36 y.o. female who presents today for a complete physical exam.  She reports consuming a  generally healthy  diet. The patient has a physically strenuous job, but has no regular exercise apart from work.  She generally feels well. She does not have additional problems to discuss today.   HPI  Annual physical  ;labs done 04/26/2022  -vitamin d deficiency - will need treatment with Drisdol for next 6 months.  -other labs normal  -sees GYN for well woman care  -[facility] to manage anxiety and depression.  --states that she feels like this is working well, she has to remember to take this medication. She states after 3 or 4 days, she starts to get irritable.   Past Medical History:  Diagnosis Date   Hx of varicella    Medical history non-contributory    Past Surgical History:  Procedure Laterality Date   CESAREAN SECTION N/A 02/25/2015   Procedure: CESAREAN SECTION;  Surgeon: Vanessa Kick, MD;  Location: [facility];  Service: Obstetrics;  Laterality: N/A;   CESAREAN SECTION N/A 06/21/2017   Procedure: REPEAT CESAREAN SECTION;  Surgeon: Jerelyn Charles, MD;  Location: [facility];  Service: Obstetrics;  Laterality: N/A;   WISDOM TOOTH EXTRACTION     Social History   Socioeconomic History   Marital status: Married    Spouse name: Not on file   Number of children: Not on file   Years of education: Not on file   Highest education level: Not on file  Occupational History   Not on file  Tobacco Use   Smoking status: Never   Smokeless tobacco: Never  Substance and Sexual Activity   Alcohol use: Yes    Comment: occ   Drug use: No   Sexual activity: Yes  Other Topics Concern   Not on file  Social History Narrative   Not on file    Social Determinants of Health   Financial Resource Strain: Not on file  Food Insecurity: Not on file  Transportation Needs: Not on file  Physical Activity: Not on file  Stress: Not on file  Social Connections: Not on file  Intimate Partner Violence: Not on file   Family Status  Relation Name Status   Father  Alive   Mother  Alive   Family History  Problem Relation Age of Onset   Cleft palate Father    Hypertension Father    Hypertension Mother    No Known Allergies  Patient Care Team: Ronnell Freshwater, NP as PCP - General (Family Medicine)   Medications: Outpatient Medications Prior to Visit  Medication Sig   sertraline (ZOLOFT) 50 MG tablet sertraline 50 mg tablet  TAKE 1 TABLET BY MOUTH EVERY DAY   No facility-administered medications prior to visit.    Review of Systems  Constitutional:  Positive for fatigue. Negative for activity change, appetite change, chills and fever.  HENT:  Negative for congestion, postnasal drip, rhinorrhea, sinus pressure, sinus pain, sneezing and sore throat.   Eyes: Negative.   Respiratory:  Negative for cough, chest tightness, shortness of breath and wheezing.   Cardiovascular:  Negative for chest pain and palpitations.  Gastrointestinal:  Negative for abdominal pain, constipation, diarrhea, nausea and vomiting.  Endocrine: Negative for cold intolerance, heat intolerance, polydipsia and polyuria.  Genitourinary:  Negative for dyspareunia, dysuria, flank pain, frequency and urgency.  Musculoskeletal:  Negative for arthralgias, back pain and myalgias.  Skin:  Negative for rash.  Allergic/Immunologic: Negative for environmental allergies.  Neurological:  Negative for dizziness, weakness and headaches.  Hematological:  Negative for adenopathy.  Psychiatric/Behavioral:  Positive for decreased concentration and dysphoric mood. The patient is nervous/anxious.     Last CBC Lab Results  Component Value Date   WBC 5.4 04/26/2022   HGB  12.5 04/26/2022   HCT 37.5 04/26/2022   MCV 82 04/26/2022   MCH 27.5 04/26/2022   RDW 12.7 04/26/2022   PLT 396 29/93/7169   Last metabolic panel Lab Results  Component Value Date   GLUCOSE 91 04/26/2022   NA 139 04/26/2022   K 4.6 04/26/2022   CL 103 04/26/2022   CO2 23 04/26/2022   BUN 8 04/26/2022   CREATININE 0.67 04/26/2022   EGFR 118 04/26/2022   CALCIUM 9.6 04/26/2022   PROT 7.4 04/26/2022   ALBUMIN 4.4 04/26/2022   LABGLOB 3.0 04/26/2022   AGRATIO 1.5 04/26/2022   BILITOT 0.3 04/26/2022   ALKPHOS 80 04/26/2022   AST 15 04/26/2022   ALT 18 04/26/2022   ANIONGAP 9 04/16/2021   Last lipids Lab Results  Component Value Date   CHOL 165 04/26/2022   HDL 54 04/26/2022   LDLCALC 98 04/26/2022   TRIG 65 04/26/2022   CHOLHDL 3.1 04/26/2022   Last hemoglobin A1c Lab Results  Component Value Date   HGBA1C 5.6 04/26/2022   Last thyroid functions Lab Results  Component Value Date   TSH 1.270 04/26/2022   Last vitamin D Lab Results  Component Value Date   VD25OH 13.9 (L) 04/26/2022       Objective     Today's Vitals   05/23/22 1606  BP: 104/63  Pulse: 63  SpO2: 97%  Weight: 204 lb 12.8 oz (92.9 kg)   Body mass index is 35.15 kg/m.   BP Readings from Last 3 Encounters:  05/23/22 104/63  04/26/22 94/61  04/16/21 (Abnormal) 101/56    Wt Readings from Last 3 Encounters:  05/23/22 204 lb 12.8 oz (92.9 kg)  04/26/22 200 lb 6.4 oz (90.9 kg)  06/21/17 184 lb (83.5 kg)     Physical Exam Vitals and nursing note reviewed.  Constitutional:      Appearance: Normal appearance. She is well-developed.  HENT:     Head: Normocephalic and atraumatic.     Right Ear: Tympanic membrane, ear canal and external ear normal.     Left Ear: Tympanic membrane, ear canal and external ear normal.     Nose: Nose normal.     Mouth/Throat:     Mouth: Mucous membranes are moist.     Pharynx: Oropharynx is clear.  Eyes:     Extraocular Movements: Extraocular movements  intact.     Conjunctiva/sclera: Conjunctivae normal.     Pupils: Pupils are equal, round, and reactive to light.  Cardiovascular:     Rate and Rhythm: Normal rate and regular rhythm.     Pulses: Normal pulses.     Heart sounds: Normal heart sounds.  Pulmonary:     Effort: Pulmonary effort is normal.     Breath sounds: Normal breath sounds.  Abdominal:     General: Bowel sounds are normal. There is no distension.     Palpations: Abdomen is soft. There is no mass.  Tenderness: There is no abdominal tenderness. There is no right CVA tenderness, left CVA tenderness, guarding or rebound.     Hernia: No hernia is present.  Musculoskeletal:        General: Normal range of motion.     Cervical back: Normal range of motion and neck supple.  Lymphadenopathy:     Cervical: No cervical adenopathy.  Skin:    General: Skin is warm and dry.     Capillary Refill: Capillary refill takes less than 2 seconds.  Neurological:     General: No focal deficit present.     Mental Status: She is alert and oriented to person, place, and time.  Psychiatric:        Mood and Affect: Mood normal.        Behavior: Behavior normal.        Thought Content: Thought content normal.        Judgment: Judgment normal.     Last depression screening scores   Row Labels 05/23/2022    4:21 PM 04/26/2022    9:08 AM  PHQ 2/9 Scores   Section Header. No data exists in this row.    PHQ - 2 Score   0 0  PHQ- 9 Score   0 0     Assessment & Plan     1. Encounter for general adult medical examination with abnormal findings Annual physical today.  Patient does see GYN provider for well woman care.  2. Vitamin D deficiency Vitamin D deficiency.  Take Drisdol weekly for the next 6 months.  Follow-up with over-the-counter vitamin D 2000 to 5000 international units daily. - ergocalciferol (DRISDOL) 1.25 MG (50000 UT) capsule; Take 1 capsule (50,000 Units total) by mouth once a week.  Dispense: 4 capsule; Refill: 5  3.  BMI 35.0-35.9,adult Discussed lowering calorie intake to 1500 calories per day and incorporating exercise into daily routine to help lose weight.    There is no immunization history for the selected administration types on file for this patient.  Health Maintenance  Topic Date Due   COVID-19 Vaccine (1) Never done   Hepatitis C Screening  Never done   PAP SMEAR-Modifier  11/22/2019   INFLUENZA VACCINE  03/27/2022   TETANUS/TDAP  04/09/2027   HIV Screening  Completed   HPV VACCINES  Aged Out    Discussed health benefits of physical activity, and encouraged her to engage in regular exercise appropriate for her age and condition.  Problem List Items Addressed This Visit       Other   Vitamin D deficiency   Relevant Medications   ergocalciferol (DRISDOL) 1.25 MG (50000 UT) capsule   BMI 35.0-35.9,adult   Other Visit Diagnoses     Encounter for general adult medical examination with abnormal findings    -  Primary        Return in about 1 year (around 05/24/2023) for health maintenance exam, FBW a week prior to visit - would like to schedule for flu shot next week.        Ronnell Freshwater, NP  [facility] at [facility] [phone] (phone) [phone] (fax)  [place]

## 2022-06-03 DIAGNOSIS — Z6835 Body mass index (BMI) 35.0-35.9, adult: Secondary | ICD-10-CM | POA: Insufficient documentation

## 2022-06-08 IMAGING — CT CT ABD-PELV W/ CM
2 of 4 series · 17 of 46 positions shown, 19 images · IV contrast (Omni 300)
Comparison: None.

CLINICAL DATA: Diarrhea, abdominal pain

EXAM:
CT ABDOMEN AND PELVIS WITH CONTRAST
TECHNIQUE: Multidetector CT imaging of the abdomen and pelvis was performed
using the standard protocol following bolus administration of
intravenous contrast.
CONTRAST:  75mL OMNIPAQUE IOHEXOL 300 MG/ML  SOLN

[Series 3: a/p w/ 5mm · axial · 0.83mm/px · z∈[+864,+1294]mm · 14 of 94 slices shown, 16 images]
[im 4/94  soft-tissue]
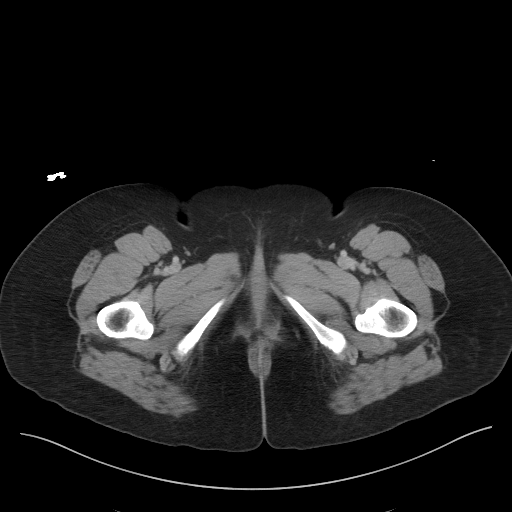
[im 4/94  bone]
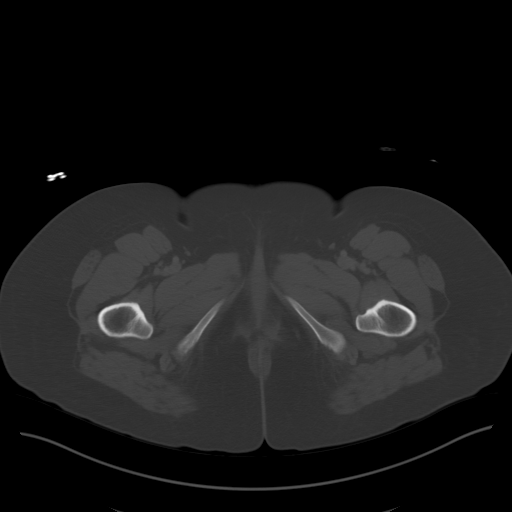
[im 12/94  soft-tissue]
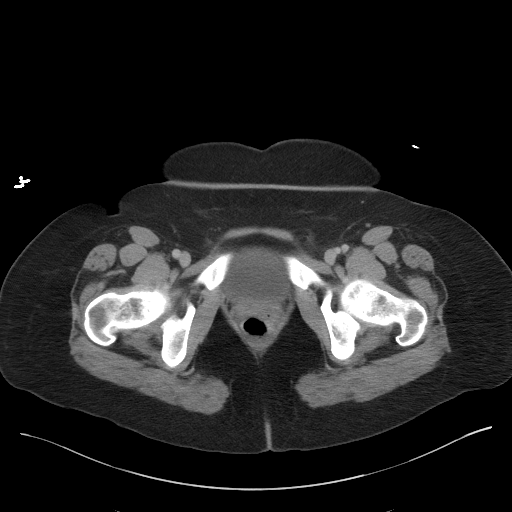
[im 19/94  soft-tissue]
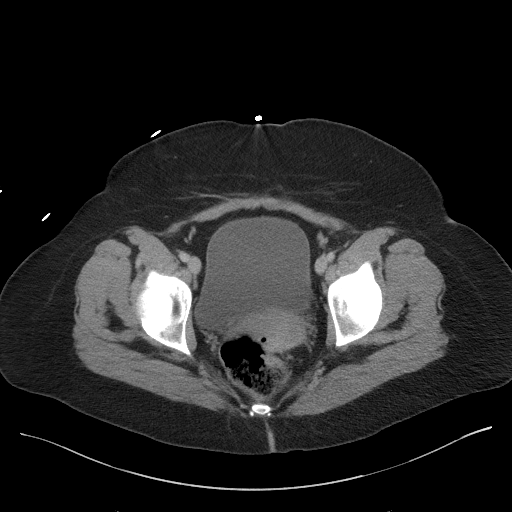
[im 27/94  soft-tissue]
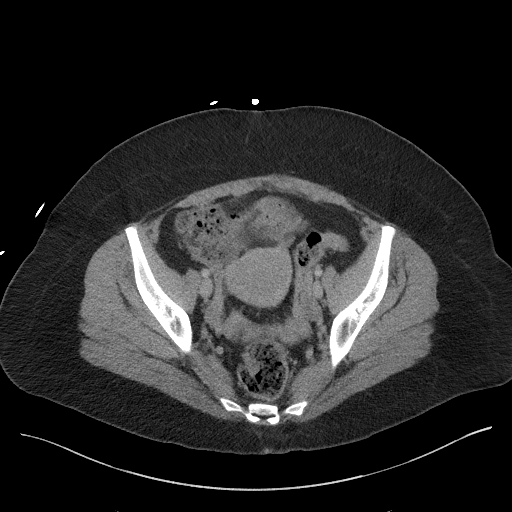
[im 30/94  soft-tissue]
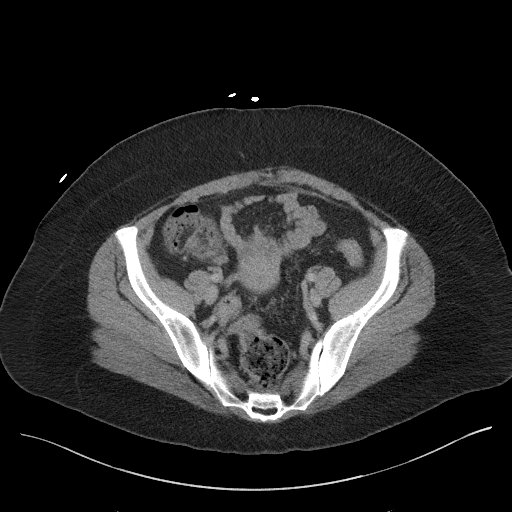
[im 38/94  soft-tissue]
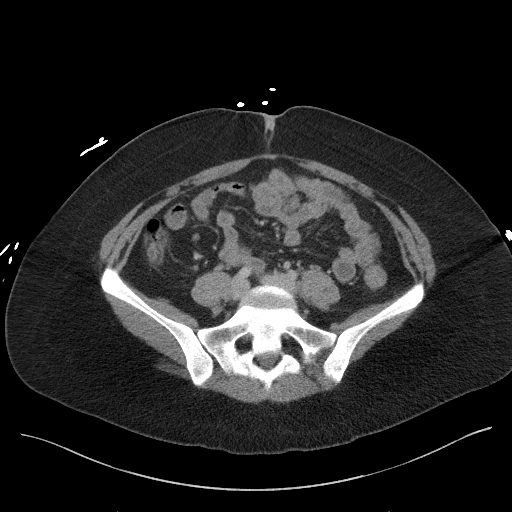
[im 45/94  soft-tissue]
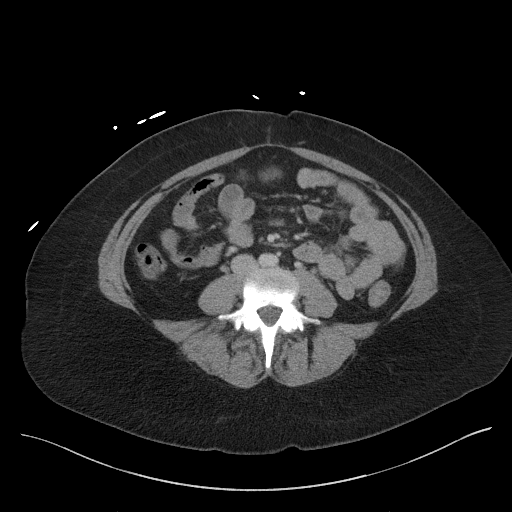
[im 49/94  soft-tissue]
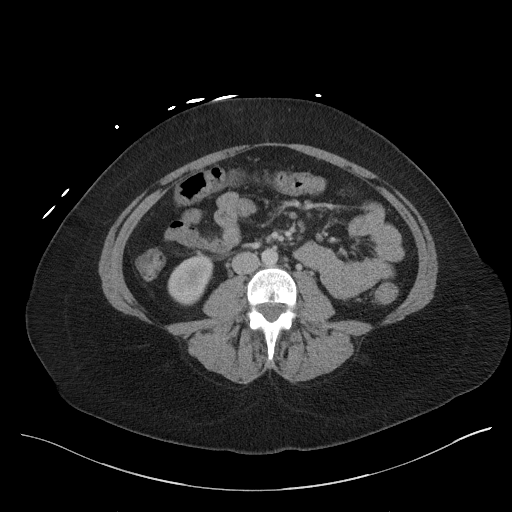
[im 56/94  soft-tissue]
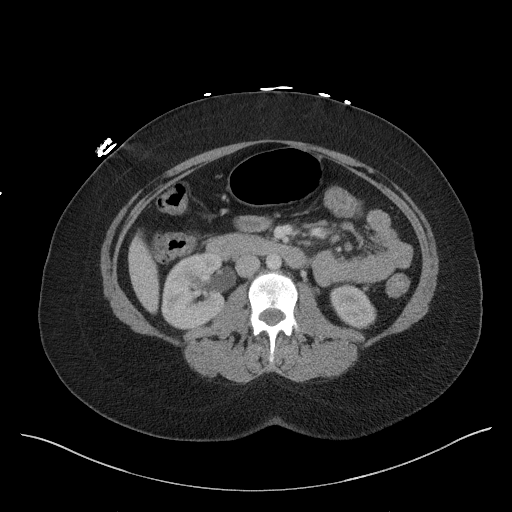
[im 56/94  bone]
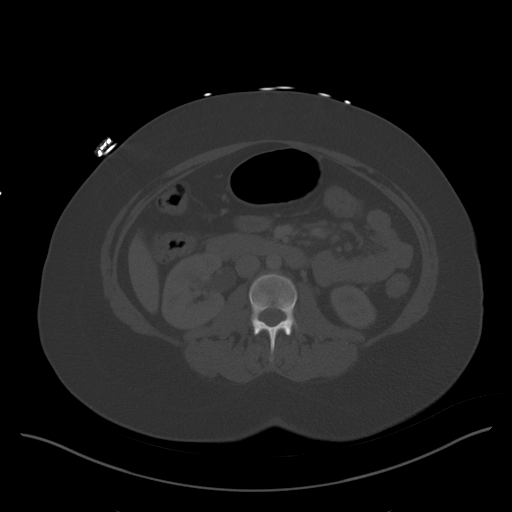
[im 64/94  soft-tissue]
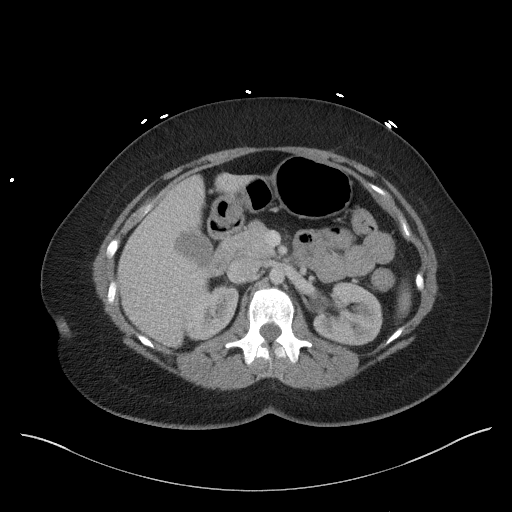
[im 71/94  soft-tissue]
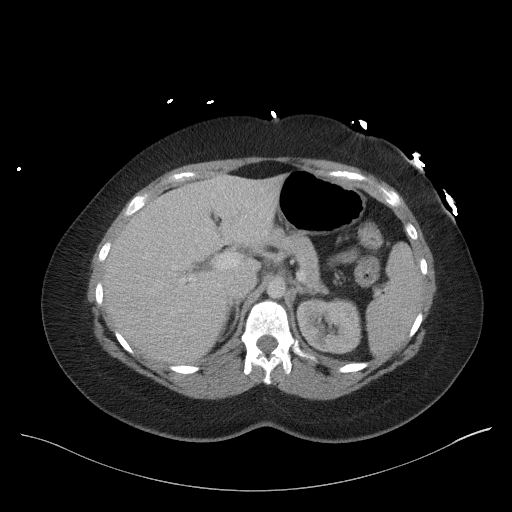
[im 75/94  soft-tissue]
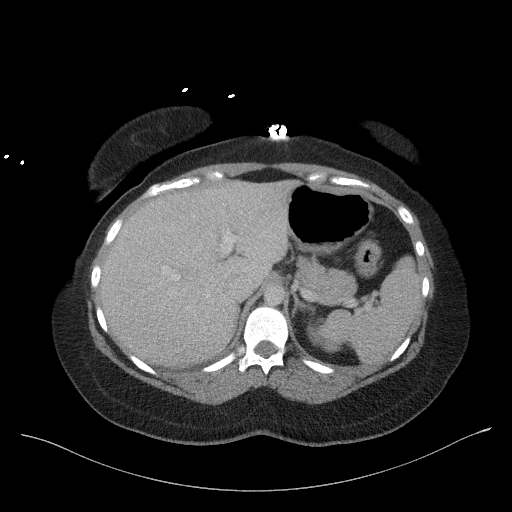
[im 82/94  soft-tissue]
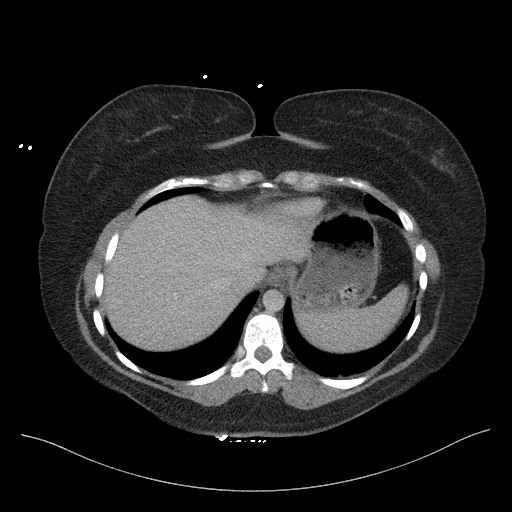
[im 90/94  soft-tissue]
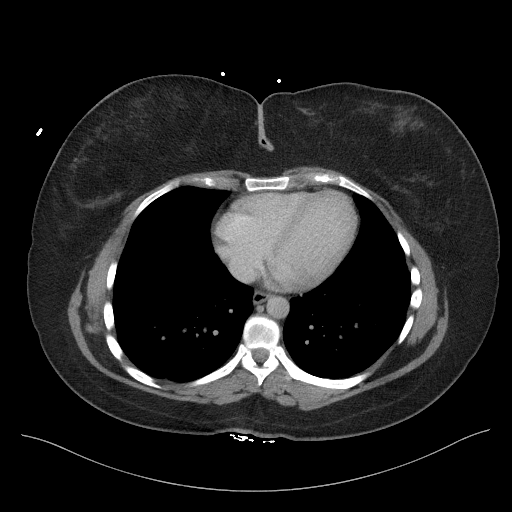

[Series 6: a/p w/ cor · coronal · 0.91mm/px · 3 of 179 slices shown]
[im 60/179  soft-tissue]
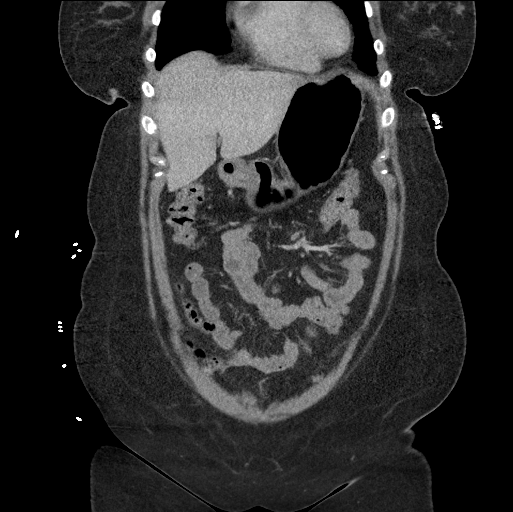
[im 80/179  soft-tissue]
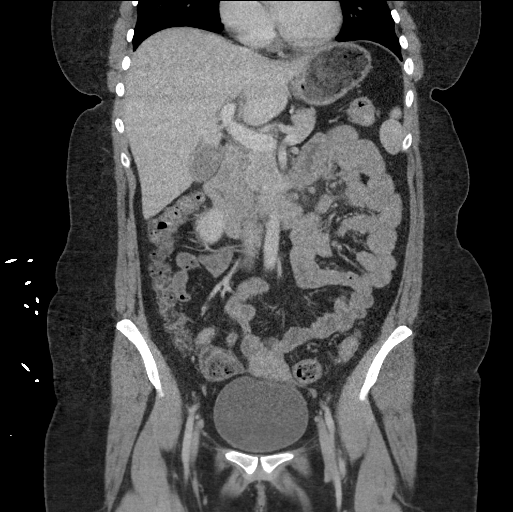
[im 99/179  soft-tissue]
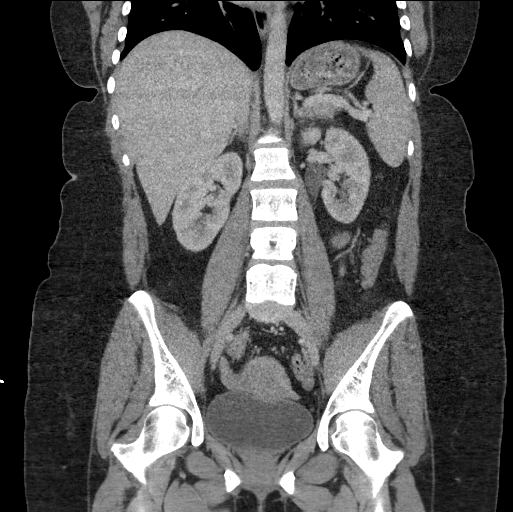

[17 of 46 positions shown; findings below may reference images not displayed]

FINDINGS: Lower chest: No pleural or pericardial effusion. Visualized lung
bases clear.

Hepatobiliary: No focal liver abnormality is seen. No gallstones,
gallbladder wall thickening, or biliary dilatation.

Pancreas: Unremarkable. No pancreatic ductal dilatation or
surrounding inflammatory changes.

Spleen: Normal in size without focal abnormality.

Adrenals/Urinary Tract: Adrenal glands unremarkable. , Unremarkable.
Urinary bladder unremarkable

Stomach/Bowel: Stomach is physiologically distended by ingested
material and gas. The small bowel is decompressed. Normal appendix.
The colon is nondilated, unremarkable.

Vascular/Lymphatic: No significant vascular findings are present. No
enlarged abdominal or pelvic lymph nodes.

Reproductive: Uterus and bilateral adnexa are unremarkable.

Other: No ascites.  No free air.

Musculoskeletal: No acute or significant osseous findings.
IMPRESSION: No acute findings.

## 2022-11-07 DIAGNOSIS — F419 Anxiety disorder, unspecified: Secondary | ICD-10-CM | POA: Diagnosis not present

## 2023-03-27 DIAGNOSIS — J069 Acute upper respiratory infection, unspecified: Secondary | ICD-10-CM | POA: Diagnosis not present

## 2023-07-19 ENCOUNTER — Other Ambulatory Visit: Payer: Self-pay | Admitting: Nurse Practitioner

## 2023-07-19 NOTE — Telephone Encounter (Signed)
Hey April. I was seeing this patient at [facility]. Can Lequita Halt or Dr. Corky Downs fill this for her? Thanks.  Herbert Seta, NP

## 2023-07-22 MED ORDER — SERTRALINE HCL 100 MG PO TABS: 100.0000 mg | ORAL_TABLET | Freq: Every day | ORAL | 1 refills | Status: DC

## 2023-07-22 NOTE — Telephone Encounter (Signed)
Copied from CRM [id number]. Topic: Clinical - Medication Refill >> Jul 19, 2023 12:39 PM Sasha H wrote: Most Recent Primary Care Visit:  Provider: Carlean Jews  Department: [facility]  Visit Type: PHYSICAL  Date: 05/23/2022  Medication: sertraline (ZOLOFT) 50 MG tablet  Has the patient contacted their pharmacy? No (Agent: If no, request that the patient contact the pharmacy for the refill. If patient does not wish to contact the pharmacy document the reason why and proceed with request.) (Agent: If yes, when and what did the pharmacy advise?)  Is this the correct pharmacy for this prescription? Yes If no, delete pharmacy and type the correct one.  This is the patient's preferred pharmacy:  [phone] Ginette Otto,  - [address] [place] [address] Phone: [phone] Fax: [phone]   Has the prescription been filled recently? Yes  Is the patient out of the medication? No, in 15 days  Has the patient been seen for an appointment in the last year OR does the patient have an upcoming appointment? Yes  Can we respond through MyChart? Yes  Agent: Please be advised that Rx refills may take up to 3 business days. We ask that you follow-up with your pharmacy.

## 2023-08-07 ENCOUNTER — Other Ambulatory Visit: Payer: Self-pay

## 2023-08-07 ENCOUNTER — Ambulatory Visit (INDEPENDENT_AMBULATORY_CARE_PROVIDER_SITE_OTHER): Payer: BC Managed Care – PPO | Admitting: Family Medicine

## 2023-08-07 VITALS — BP 92/59 | HR 73 | Ht 64.0 in | Wt 206.0 lb

## 2023-08-07 DIAGNOSIS — F32A Depression, unspecified: Secondary | ICD-10-CM | POA: Diagnosis not present

## 2023-08-07 DIAGNOSIS — Z0000 Encounter for general adult medical examination without abnormal findings: Secondary | ICD-10-CM | POA: Diagnosis not present

## 2023-08-07 DIAGNOSIS — E559 Vitamin D deficiency, unspecified: Secondary | ICD-10-CM

## 2023-08-07 DIAGNOSIS — F419 Anxiety disorder, unspecified: Secondary | ICD-10-CM

## 2023-08-07 NOTE — Assessment & Plan Note (Signed)
Recheck today.  If low send in weekly vitamin D for 12 weeks and recheck in 12 weeks.

## 2023-08-07 NOTE — Progress Notes (Signed)
   Annual physical  Subjective   Patient ID: Krystal Taylor, female    DOB: 02/17/1986  Age: 37 y.o. MRN: [id number]  Chief Complaint  Patient presents with   Annual Exam   HPI Krystal Taylor is a 37 y.o. old female here  for annual exam.   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  She is married with 2 children ages 79 and 51.  She does not use tobacco or alcohol.  Does use delta 8 Gummies that she gets from a legal guardian but dispensary.  Patient on a regular diet.  No regular exercise.  Patient has regular periods and is not on birth control.  Patient has a family history of cervical cancer in her grandmother.  No history of colon cancer or breast cancer she is aware of.  We discussed the patient's vitamin D levels from last year.  She is not currently taking vitamin D.  Did take it for a while but was not consistent with it then stopped.  She sees her obstetrician in January for Pap smear.  Anxiety/depression-she takes sertraline 100 mg.  Has been on it for a while at this dose.  Was getting it from [facility] but she was told by them since she is stable they no longer need to see her and she can get refills from her PCP.  The ASCVD Risk score (Arnett DK, et al., 2019) failed to calculate for the following reasons:   The 2019 ASCVD risk score is only valid for ages 103 to 23  Health Maintenance Due  Topic Date Due   Hepatitis C Screening  Never done   Cervical Cancer Screening (HPV/Pap Cotest)  11/22/2019   INFLUENZA VACCINE  03/28/2023   COVID-19 Vaccine (1 - 2023-24 season) Never done      Objective:     BP (!) 92/59   Pulse 73   Ht 5\' 4"  (1.626 m)   Wt 206 lb (93.4 kg)   LMP 08/07/2023   SpO2 95%   BMI 35.36 kg/m    Physical Exam General: Alert, oriented HEENT: PERRLA, EOMI, moist mucosa CV: Regular rate and rhythm no murmurs Pulmonary: Lungs are bilaterally GI: Soft, normal bowel  sounds MSK: Strength equal bilaterally Extremities: No pedal edema Skin: Warm and dry no rashes. Psych: Pleasant affect.   No results found for any visits on 08/07/23.      Assessment & Plan:   Physical exam, annual  Vitamin D deficiency Assessment & Plan: Recheck today.  If low send in weekly vitamin D for 12 weeks and recheck in 12 weeks.  Orders: -     VITAMIN D 25 Hydroxy (Vit-D Deficiency, Fractures)  Anxiety and depression Assessment & Plan: Patient stable on 100 mg sertraline.  Who is seeing [facility].  They told her she can get refills from her PCP now.  No concerns.  Will refill sertraline going forward.      Return in about 1 year (around 08/06/2024) for physical.    [name], MD

## 2023-08-07 NOTE — Assessment & Plan Note (Signed)
Patient stable on 100 mg sertraline.  Who is seeing [facility].  They told her she can get refills from her PCP now.  No concerns.  Will refill sertraline going forward.

## 2023-08-07 NOTE — Patient Instructions (Addendum)
It was nice to see you today,  We addressed the following topics today: -I will recheck your vitamin D level.  If it is low we will send in a weekly vitamin D prescription and recheck it in about 3 months  Have a great day,  Frederic Jericho, MD

## 2023-08-08 ENCOUNTER — Encounter: Payer: Self-pay | Admitting: Family Medicine

## 2023-08-08 ENCOUNTER — Other Ambulatory Visit: Payer: Self-pay | Admitting: Family Medicine

## 2023-08-08 DIAGNOSIS — E559 Vitamin D deficiency, unspecified: Secondary | ICD-10-CM

## 2023-08-08 LAB — VITAMIN D 25 HYDROXY (VIT D DEFICIENCY, FRACTURES): Vit D, 25-Hydroxy: 12 ng/mL — ABNORMAL LOW (ref 30.0–100.0)

## 2023-08-08 MED ORDER — VITAMIN D (ERGOCALCIFEROL) 1.25 MG (50000 UNIT) PO CAPS: 50000.0000 [IU] | ORAL_CAPSULE | ORAL | 0 refills | Status: AC

## 2023-08-15 ENCOUNTER — Telehealth: Payer: Self-pay

## 2023-08-15 DIAGNOSIS — J069 Acute upper respiratory infection, unspecified: Secondary | ICD-10-CM | POA: Diagnosis not present

## 2023-08-15 NOTE — Telephone Encounter (Signed)
Copied from CRM [id number]. Topic: Clinical - Medical Advice >> Aug 14, 2023  5:06 PM Elle L wrote: Reason for CRM: The patient thinks she may have a sinus infection. She states it started 2 days ago and has progressively gotten worse. She has a dry cough and pressure behind her face. She was in the office last week and is [facility] [address] [facility] [address], [place] [address]. Her call back number is [phone].

## 2023-08-15 NOTE — Telephone Encounter (Signed)
Copied from CRM [id number]. Topic: Clinical - Medical Advice >> Aug 15, 2023  9:33 AM Herbert Seta B wrote: Reason for CRM: Patient was seen 1 week ago for sinus infection symptoms, feeling worse would like medication or appointment today.

## 2023-08-15 NOTE — Telephone Encounter (Signed)
LVM for pt to call office to inform her that she would need an appointment and we currently do not have any openings, she should probably go to the urgent care if she feels like she is in need of medication.

## 2023-08-15 NOTE — Telephone Encounter (Unsigned)
Copied from CRM [id number]. Topic: Clinical - Medical Advice >> Aug 15, 2023 11:32 AM Ivette P wrote: Reason for CRM: The patient thinks she may have a sinus infection. She states it started 2 days ago and has progressively gotten worse. She has a dry cough and pressure behind her face. She was in the office last week and is [facility] [address] [facility] [address], [place] [address]. Her call back number is [phone].

## 2023-09-06 DIAGNOSIS — J209 Acute bronchitis, unspecified: Secondary | ICD-10-CM | POA: Diagnosis not present

## 2023-09-09 ENCOUNTER — Ambulatory Visit: Payer: BC Managed Care – PPO | Admitting: Family Medicine

## 2023-10-31 ENCOUNTER — Other Ambulatory Visit: Payer: BC Managed Care – PPO

## 2023-10-31 DIAGNOSIS — E559 Vitamin D deficiency, unspecified: Secondary | ICD-10-CM | POA: Diagnosis not present

## 2023-11-01 ENCOUNTER — Encounter: Payer: Self-pay | Admitting: Family Medicine

## 2023-11-01 LAB — VITAMIN D 25 HYDROXY (VIT D DEFICIENCY, FRACTURES): Vit D, 25-Hydroxy: 38.9 ng/mL (ref 30.0–100.0)

## 2023-12-19 ENCOUNTER — Other Ambulatory Visit: Payer: Self-pay | Admitting: Family Medicine

## 2023-12-19 MED ORDER — SERTRALINE HCL 100 MG PO TABS: 100.0000 mg | ORAL_TABLET | Freq: Every day | ORAL | 1 refills | Status: DC

## 2023-12-19 NOTE — Telephone Encounter (Signed)
 Copied from CRM [id number]. Topic: Clinical - Medication Refill >> Dec 19, 2023  4:29 PM Ivette P wrote: Most Recent Primary Care Visit:  Provider: [facility]  Department: [facility]  Visit Type: LAB VISIT  Date: 10/31/2023  Medication: sertraline  (ZOLOFT ) 100 MG tablet  Has the patient contacted their pharmacy? Yes (Agent: If no, request that the patient contact the pharmacy for the refill. If patient does not wish to contact the pharmacy document the reason why and proceed with request.) (Agent: If yes, when and what did the pharmacy advise?)  Is this the correct pharmacy for this prescription? Yes If no, delete pharmacy and type the correct one.  This is the patient's preferred pharmacy:  [phone] Jonette Nestle, [place] - [address] [place] [address] Phone: [phone] Fax: [phone]   Has the prescription been filled recently? No, 07/21/24  Is the patient out of the medication? Yes, pt has 5-6 days left   Has the patient been seen for an appointment in the last year OR does the patient have an upcoming appointment? Yes, 08/06/24  Can we respond through MyChart? Yes  Agent: Please be advised that Rx refills may take up to 3 business days. We ask that you follow-up with your pharmacy.

## 2023-12-19 NOTE — Telephone Encounter (Signed)
This was handled in another message

## 2024-04-08 DIAGNOSIS — Z01419 Encounter for gynecological examination (general) (routine) without abnormal findings: Secondary | ICD-10-CM | POA: Diagnosis not present

## 2024-08-06 ENCOUNTER — Encounter: Payer: Self-pay | Admitting: Family Medicine

## 2024-08-06 ENCOUNTER — Ambulatory Visit: Payer: BC Managed Care – PPO | Admitting: Family Medicine

## 2024-08-06 VITALS — BP 100/63 | HR 81 | Ht 64.0 in | Wt 215.4 lb

## 2024-08-06 DIAGNOSIS — F419 Anxiety disorder, unspecified: Secondary | ICD-10-CM

## 2024-08-06 DIAGNOSIS — Z0000 Encounter for general adult medical examination without abnormal findings: Secondary | ICD-10-CM | POA: Diagnosis not present

## 2024-08-06 DIAGNOSIS — F32A Depression, unspecified: Secondary | ICD-10-CM | POA: Diagnosis not present

## 2024-08-06 NOTE — Patient Instructions (Signed)
 It was nice to see you today,  We addressed the following topics today: - I have sent in your sertraline .    Have a great day,  Rolan Slain, MD

## 2024-08-06 NOTE — Progress Notes (Signed)
° °  Annual physical  Subjective    Patient ID: Krystal Taylor, female    DOB: October 10, 1985  Age: 38 y.o. MRN: [id number]  Chief Complaint  Patient presents with   Annual Exam   HPI Krystal Taylor is a 38 y.o. old female here  for annual exam.   Both parents recently diagnosed with lung cancer.  Both were smokers.   Work: works at a [employer].  Relationship:in a relationship Children:2 Tobacco:no Alcohol:no Menstruation:regular Diet:regular Exercise:no  Family history of breast, ovarian, endometrial, colorectal cancer:no   Other providers:gyn - Krystal Taylor   The ASCVD Risk score (Arnett DK, et al., 2019) failed to calculate for the following reasons:   The 2019 ASCVD risk score is only valid for ages 56 to 66  Health Maintenance Due  Topic Date Due   Hepatitis C Screening  Never done   Hepatitis B Vaccines 19-59 Average Risk (1 of 3 - 19+ 3-dose series) Never done   HPV VACCINES (1 - 3-dose SCDM series) Never done   Cervical Cancer Screening (HPV/Pap Cotest)  11/22/2019   COVID-19 Vaccine (1 - 2025-26 season) Never done      Objective:     BP 100/63   Pulse 81   Ht 5' 4 (1.626 m)   Wt 215 lb 6.4 oz (97.7 kg)   LMP 07/24/2024   SpO2 100%   BMI 36.97 kg/m    Physical Exam Gen: alert, oriented HEENT: perrla, eomi, mmm CV: rrr, no murmur Pulm: lctab. No wheeze or crackles.  GI: soft, nbs.  Nontender to palpation MSK: strength equal b/l. Normal gait Ext: no pedal edema Skin: warm and dry, no rashes Psych: pleasant affect.  Spontaneous speech   No results found for any visits on 08/06/24.      Assessment & Plan:   Physical exam, annual  Anxiety and depression Assessment & Plan: Continue sertraline  100mg  daily. Doing well on current dose.    Other orders -     Sertraline  HCl; Take 1 tablet (100 mg total) by mouth daily.  Dispense: 90 tablet; Refill: 3     Return in about 1 year (around 08/06/2025) for physical.    Krystal MARLA Slain, MD

## 2024-08-06 NOTE — Assessment & Plan Note (Signed)
 Continue sertraline  100mg  daily. Doing well on current dose.
